# Patient Record
Sex: Female | Born: 1975
Health system: Southern US, Community
[De-identification: ages and names within clinical notes are randomized; demographics above are authoritative.]

## PROBLEM LIST (undated history)

## (undated) ENCOUNTER — Inpatient Hospital Stay (HOSPITAL_COMMUNITY): Payer: Self-pay

---

## 2000-07-20 ENCOUNTER — Encounter: Payer: Self-pay | Admitting: Internal Medicine

## 2000-07-20 ENCOUNTER — Encounter: Admission: RE | Admit: 2000-07-20 | Discharge: 2000-07-20 | Payer: Self-pay | Admitting: Internal Medicine

## 2000-07-24 ENCOUNTER — Encounter: Payer: Self-pay | Admitting: Internal Medicine

## 2000-07-24 ENCOUNTER — Encounter: Admission: RE | Admit: 2000-07-24 | Discharge: 2000-07-24 | Payer: Self-pay | Admitting: Internal Medicine

## 2001-03-03 ENCOUNTER — Other Ambulatory Visit: Admission: RE | Admit: 2001-03-03 | Discharge: 2001-03-03 | Payer: Self-pay | Admitting: Obstetrics and Gynecology

## 2001-12-02 ENCOUNTER — Emergency Department (HOSPITAL_COMMUNITY): Admission: EM | Admit: 2001-12-02 | Discharge: 2001-12-03 | Payer: Self-pay | Admitting: Emergency Medicine

## 2002-03-15 ENCOUNTER — Other Ambulatory Visit: Admission: RE | Admit: 2002-03-15 | Discharge: 2002-03-15 | Payer: Self-pay | Admitting: Obstetrics and Gynecology

## 2003-03-03 ENCOUNTER — Encounter: Payer: Self-pay | Admitting: Internal Medicine

## 2003-03-03 ENCOUNTER — Encounter: Admission: RE | Admit: 2003-03-03 | Discharge: 2003-03-03 | Payer: Self-pay | Admitting: Internal Medicine

## 2003-03-10 ENCOUNTER — Encounter: Payer: Self-pay | Admitting: Internal Medicine

## 2003-03-10 ENCOUNTER — Encounter: Admission: RE | Admit: 2003-03-10 | Discharge: 2003-03-10 | Payer: Self-pay | Admitting: Internal Medicine

## 2003-04-20 ENCOUNTER — Encounter: Payer: Self-pay | Admitting: Surgery

## 2003-04-20 ENCOUNTER — Observation Stay (HOSPITAL_COMMUNITY): Admission: RE | Admit: 2003-04-20 | Discharge: 2003-04-21 | Payer: Self-pay | Admitting: Surgery

## 2003-04-20 ENCOUNTER — Encounter (INDEPENDENT_AMBULATORY_CARE_PROVIDER_SITE_OTHER): Payer: Self-pay

## 2003-09-23 HISTORY — PX: GALLBLADDER SURGERY: SHX652

## 2004-02-22 ENCOUNTER — Other Ambulatory Visit: Admission: RE | Admit: 2004-02-22 | Discharge: 2004-02-22 | Payer: Self-pay | Admitting: Obstetrics and Gynecology

## 2004-09-11 ENCOUNTER — Emergency Department (HOSPITAL_COMMUNITY): Admission: EM | Admit: 2004-09-11 | Discharge: 2004-09-11 | Payer: Self-pay | Admitting: Emergency Medicine

## 2005-05-27 ENCOUNTER — Ambulatory Visit: Payer: Self-pay | Admitting: Endocrinology

## 2005-11-28 ENCOUNTER — Ambulatory Visit (HOSPITAL_COMMUNITY): Admission: RE | Admit: 2005-11-28 | Discharge: 2005-11-28 | Payer: Self-pay | Admitting: Internal Medicine

## 2006-05-15 ENCOUNTER — Other Ambulatory Visit: Admission: RE | Admit: 2006-05-15 | Discharge: 2006-05-15 | Payer: Self-pay | Admitting: Obstetrics and Gynecology

## 2007-08-30 ENCOUNTER — Inpatient Hospital Stay (HOSPITAL_COMMUNITY): Admission: AD | Admit: 2007-08-30 | Discharge: 2007-08-30 | Payer: Self-pay | Admitting: Obstetrics and Gynecology

## 2008-09-12 ENCOUNTER — Ambulatory Visit: Payer: Self-pay | Admitting: Internal Medicine

## 2008-10-26 ENCOUNTER — Ambulatory Visit: Payer: Self-pay | Admitting: Internal Medicine

## 2008-11-23 ENCOUNTER — Ambulatory Visit: Payer: Self-pay | Admitting: Internal Medicine

## 2009-08-31 ENCOUNTER — Ambulatory Visit: Payer: Self-pay | Admitting: Internal Medicine

## 2010-06-11 ENCOUNTER — Ambulatory Visit: Payer: Self-pay | Admitting: Internal Medicine

## 2010-09-12 ENCOUNTER — Ambulatory Visit: Payer: Self-pay | Admitting: Internal Medicine

## 2010-10-01 ENCOUNTER — Encounter: Payer: Self-pay | Admitting: Gastroenterology

## 2010-10-24 DIAGNOSIS — F411 Generalized anxiety disorder: Secondary | ICD-10-CM

## 2010-10-24 NOTE — Letter (Signed)
Summary: New Patient letter  [facility]  [address] [place], [place] [address]   Phone: [phone]  Fax: [phone]       10/01/2010 MRN: [id number]  [facility] [address] [place] [address]  Dear Ms. Rubendall,  Welcome to the Gastroenterology Division at [facility].    You are scheduled to see Dr.  Christella Hartigan on 11-06-10 at 10am on the 3rd floor at [facility], [address]  We ask that you try to arrive at our office 15 minutes prior to your appointment time to allow for check-in.  We would like you to complete the enclosed self-administered evaluation form prior to your visit and bring it with you on the day of your appointment.  We will review it with you.  Also, please bring a complete list of all your medications or, if you prefer, bring the medication bottles and we will list them.  Please bring your insurance card so that we may make a copy of it.  If your insurance requires a referral to see a specialist, please bring your referral form from your primary care physician.  Co-payments are due at the time of your visit and may be paid by cash, check or credit card.     Your office visit will consist of a consult with your physician (includes a physical exam), any laboratory testing he/she may order, scheduling of any necessary diagnostic testing (e.g. x-ray, ultrasound, CT-scan), and scheduling of a procedure (e.g. Endoscopy, Colonoscopy) if required.  Please allow enough time on your schedule to allow for any/all of these possibilities.    If you cannot keep your appointment, please call [phone] to cancel or reschedule prior to your appointment date.  This allows [place] the opportunity to schedule an appointment for another patient in need of care.  If you do not cancel or reschedule by 5 p.m. the business day prior to your appointment date, you will be charged a $50.00 late cancellation/no-show fee.    Thank you for choosing [facility] for your medical needs.  We appreciate the opportunity to care for you.  Please visit [place] at our website  to learn more about our practice.                     Sincerely,                                                             The Gastroenterology Division

## 2010-11-01 ENCOUNTER — Ambulatory Visit (INDEPENDENT_AMBULATORY_CARE_PROVIDER_SITE_OTHER): Payer: BC Managed Care – PPO | Admitting: Internal Medicine

## 2010-11-01 DIAGNOSIS — F411 Generalized anxiety disorder: Secondary | ICD-10-CM

## 2010-11-06 ENCOUNTER — Encounter (INDEPENDENT_AMBULATORY_CARE_PROVIDER_SITE_OTHER): Payer: Self-pay | Admitting: *Deleted

## 2010-11-06 ENCOUNTER — Encounter: Payer: Self-pay | Admitting: Gastroenterology

## 2010-11-06 ENCOUNTER — Other Ambulatory Visit: Payer: BC Managed Care – PPO

## 2010-11-06 ENCOUNTER — Other Ambulatory Visit: Payer: Self-pay | Admitting: Gastroenterology

## 2010-11-06 ENCOUNTER — Ambulatory Visit (INDEPENDENT_AMBULATORY_CARE_PROVIDER_SITE_OTHER): Payer: BC Managed Care – PPO | Admitting: Gastroenterology

## 2010-11-06 DIAGNOSIS — R1013 Epigastric pain: Secondary | ICD-10-CM

## 2010-11-06 DIAGNOSIS — K3189 Other diseases of stomach and duodenum: Secondary | ICD-10-CM

## 2010-11-06 LAB — COMPREHENSIVE METABOLIC PANEL
ALT: 28 U/L (ref 0–35)
AST: 22 U/L (ref 0–37)
Albumin: 4.2 g/dL (ref 3.5–5.2)
Alkaline Phosphatase: 59 U/L (ref 39–117)
BUN: 9 mg/dL (ref 6–23)
CO2: 28 mEq/L (ref 19–32)
Calcium: 9.2 mg/dL (ref 8.4–10.5)
Chloride: 106 mEq/L (ref 96–112)
Creatinine, Ser: 0.6 mg/dL (ref 0.4–1.2)
GFR: 113.06 mL/min (ref >60.00)
Glucose, Bld: 80 mg/dL (ref 70–99)
Potassium: 4.1 mEq/L (ref 3.5–5.1)
Sodium: 140 mEq/L (ref 135–145)
Total Bilirubin: 0.4 mg/dL (ref 0.3–1.2)
Total Protein: 7 g/dL (ref 6.0–8.3)

## 2010-11-06 LAB — IGA: IgA: 198 mg/dL (ref 68–378)

## 2010-11-07 LAB — CBC WITH DIFFERENTIAL/PLATELET
Basophils Absolute: 0 10*3/uL (ref 0.0–0.1)
Basophils Relative: 0.4 % (ref 0.0–3.0)
Eosinophils Absolute: 1.2 10*3/uL — ABNORMAL HIGH (ref 0.0–0.7)
Eosinophils Relative: 16.5 % — ABNORMAL HIGH (ref 0.0–5.0)
HCT: 39.8 % (ref 36.0–46.0)
Hemoglobin: 13.7 g/dL (ref 12.0–15.0)
Lymphocytes Relative: 31.5 % (ref 12.0–46.0)
Lymphs Abs: 2.3 10*3/uL (ref 0.7–4.0)
MCHC: 34.5 g/dL (ref 30.0–36.0)
MCV: 89.5 fl (ref 78.0–100.0)
Monocytes Absolute: 0.3 10*3/uL (ref 0.1–1.0)
Monocytes Relative: 4.5 % (ref 3.0–12.0)
Neutro Abs: 3.5 10*3/uL (ref 1.4–7.7)
Neutrophils Relative %: 47.1 % (ref 43.0–77.0)
Platelets: 234 10*3/uL (ref 150.0–400.0)
RBC: 4.44 Mil/uL (ref 3.87–5.11)
RDW: 12.7 % (ref 11.5–14.6)
WBC: 7.4 10*3/uL (ref 4.5–10.5)

## 2010-11-08 DIAGNOSIS — E039 Hypothyroidism, unspecified: Secondary | ICD-10-CM | POA: Insufficient documentation

## 2010-11-08 LAB — CONVERTED CEMR LAB: Tissue Transglutaminase Ab, IgA: 4.5 units (ref <20)

## 2010-11-13 NOTE — Letter (Signed)
Summary: EGD Instructions  [facility]  [address] [place], [place] [address]   Phone: [phone]  Fax: [phone]       Danielle Doyle    1976/08/25    MRN: [id number]       Procedure Day /Date:12/18/10 WED     Arrival Time: 1230 pm     Procedure Time:130 pm     Location of Procedure:                    [facility] (4th Floor)    PREPARATION FOR ENDOSCOPY   On 12/18/10  THE DAY OF THE PROCEDURE:  1.   No solid foods, milk or milk products are allowed after midnight the night before your procedure.  2.   Do not drink anything colored red or purple.  Avoid juices with pulp.  No orange juice.  3.  You may drink clear liquids until1130 am  which is 2 hours before your procedure.                                                                                                CLEAR LIQUIDS INCLUDE: Water Jello Ice Popsicles Tea (sugar ok, no milk/cream) Powdered fruit flavored drinks Coffee (sugar ok, no milk/cream) Gatorade Juice: apple, white grape, white cranberry  Lemonade Clear bullion, consomm, broth Carbonated beverages (any kind) Strained chicken noodle soup Hard Candy   MEDICATION INSTRUCTIONS  Unless otherwise instructed, you should take regular prescription medications with a small sip of water as early as possible the morning of your procedure.               OTHER INSTRUCTIONS  You will need a responsible adult at least 37 years of age to accompany you and drive you home.   This person must remain in the waiting room during your procedure.  Wear loose fitting clothing that is easily removed.  Leave jewelry and other valuables at home.  However, you may wish to bring a book to read or an iPod/MP3 player to listen to music as you wait for your procedure to start.  Remove all body piercing jewelry and leave at home.  Total time from sign-in until discharge is approximately 2-3 hours.  You should go home directly  after your procedure and rest.  You can resume normal activities the day after your procedure.  The day of your procedure you should not:   Drive   Make legal decisions   Operate machinery   Drink alcohol   Return to work  You will receive specific instructions about eating, activities and medications before you leave.    The above instructions have been reviewed and explained to me by   _______________________    I fully understand and can verbalize these instructions _____________________________ Date _________

## 2010-11-13 NOTE — Assessment & Plan Note (Signed)
History of Present Illness Visit Type: Initial Consult Primary GI MD: Rob Bunting MD Primary Provider: Marlan Palau, MD Requesting Provider: Marlan Palau, MD Chief Complaint: Starting in November pt noticed after eating certain foods pt will have upper abd pain, nausea and bloating.  History of Present Illness:      very pleasant 35 year old woman who has noticed certain foods or overeating or stressful situations can cause epigastric discomfort ("very similar to GB pain").  Pain and burning, bloating, mild nausea.  Will last 2-4 hours, then it will ease off.  Drinking a carbonated beverage will often help it quickly.    Yogurt can trigger it.  She drinks soy milk usually.  She had h. pylori years ago, treated with antibiotics.  started fluoxetine  started dexilant recently, not really taking it reliably due to side effects (palpatations, insomnia).  DId help with her abd discomforts.  No nsaids.  Overall wieght up a bit.  husband had major brain surgery for accusic neuroma at [facility].           Current Medications (verified): 1)  Metformin Hcl 500 Mg Tabs (Metformin Hcl) .... One Tablet By Mouth Once Daily 2)  Fluoxetine Hcl 20 Mg Caps (Fluoxetine Hcl) .... One Tablet By Mouth Once Daily 3)  Dexilant 60 Mg Cpdr (Dexlansoprazole) .... One Tablet By Mouth Twice A Week  Allergies (verified): 1)  ! Doxycycline  Past History:  Past Medical History: endometriosis, anemia, anxiety, UTI  Past Surgical History: lap chole 2004 for dysfucntional GB endometrial polyps removed 2003, 2004 Uterine polyps removed 2009 and 2010  Family History: breast cancer Crohn's disease Esophagus cancer  Social History: she is married, she has no children, she is an Production designer, theatre/television/film for higher education, she does not smokees or drink alcohol. Her husband recently underwent brain surgery at [facility], he is recovering well  Review of Systems       Pertinent positive and negative review of  systems were noted in the above HPI and GI specific review of systems.  All other review of systems was otherwise negative.   Vital Signs:  Patient profile:   35 year old female Height:      65 inches Weight:      190 pounds BMI:     31.73 Pulse rate:   88 / minute Pulse rhythm:   regular BP sitting:   118 / 68  (left arm) Cuff size:   regular  Vitals Entered By: Christie Nottingham CMA Duncan Dull) (November 06, 2010 10:15 AM)  Physical Exam  Additional Exam:  Constitutional: generally well appearing Psychiatric: alert and oriented times 3 Eyes: extraocular movements intact Mouth: oropharynx moist, no lesions Neck: supple, no lymphadenopathy Cardiovascular: heart regular rate and rythm Lungs: CTA bilaterally Abdomen: soft, non-tender, non-distended, no obvious ascites, no peritoneal signs, normal bowel sounds Extremities: no lower extremity edema bilaterally Skin: no lesions on visible extremities    Impression & Recommendations:  Problem # 1:  Dyspepsia lactose intolerance may be playing a role. He gastritis, peptic ulcer disease is also possible. We will proceed with EGD at her soonest convenience. She has had her gallbladder removed. She will get basic set of labs including CBC, complete metabolic profile, celiac sprue testing. She is very clear that dexilant has definitely helped her symptoms however she did not want to take it for possible side effects that is causing. None of those side effects are listed as usual problems with the medicine however think it is best to just try a  different proton pump inhibitor.  Other Orders: TLB-CBC Platelet - w/Differential (85025-CBCD) TLB-CMP (Comprehensive Metabolic Pnl) (80053-COMP) TLB-IgA (Immunoglobulin A) (82784-IGA) T-Sprue Panel (Celiac Disease Aby Eval) (83516x3/[address])  Patient Instructions: 1)  Try avoiding dairy for one week.  If you feel better then you need to do a "dairy" challenge to see if you are lactose  intolerant. 2)  Stop the dexilant. 3)  Trial of nexium, once daily, 20-30 min before breakfast. 4)  You will be scheduled to have an upper endoscopy. 5)  You will get lab test(s) done today (cbc, cmet, total IgA level, celiac sprue). 6)  A copy of this information will be sent to Dr. Lenord Fellers. 7)  The medication list was reviewed and reconciled.  All changed / newly prescribed medications were explained.  A complete medication list was provided to the patient / caregiver.  Appended Document: Orders Update/EGD    Clinical Lists Changes  Orders: Added new Test order of EGD (EGD) - Signed      Appended Document:  Samples Given # 5  -Lot Number:  [id number]

## 2010-12-18 ENCOUNTER — Other Ambulatory Visit: Payer: BC Managed Care – PPO | Admitting: Gastroenterology

## 2011-01-17 ENCOUNTER — Ambulatory Visit (INDEPENDENT_AMBULATORY_CARE_PROVIDER_SITE_OTHER): Payer: BC Managed Care – PPO | Admitting: Internal Medicine

## 2011-01-17 DIAGNOSIS — E039 Hypothyroidism, unspecified: Secondary | ICD-10-CM

## 2011-02-07 NOTE — Op Note (Signed)
Danielle Doyle, Danielle Doyle                           ACCOUNT NO.:  [id number]   MEDICAL RECORD NO.:  [id number]                   PATIENT TYPE:  AMB   LOCATION:  DAY                                  FACILITY:  [facility]   PHYSICIAN:  Abigail Miyamoto, M.D.              DATE OF BIRTH:  1976/03/12   DATE OF PROCEDURE:  04/20/2003  DATE OF DISCHARGE:                                 OPERATIVE REPORT   PREOPERATIVE DIAGNOSIS:  Biliary dyskinesia.   POSTOPERATIVE DIAGNOSIS:  Biliary dyskinesia.   PROCEDURE:  Laparoscopic cholecystectomy with intraoperative cholangiogram.   SURGEON:  Abigail Miyamoto, M.D.   ANESTHESIA:  General endotracheal anesthesia.   ESTIMATED BLOOD LOSS:  Minimal.   INDICATIONS FOR PROCEDURE:  Danielle Doyle is a 35 year old female whose been  experiencing right upper quadrant epigastric abdominal pain, nausea and  vomiting which is related to fatty meals. She has had a thorough workup  including a normal ultrasound and a HIDA scan showing a 30% ejection  fraction. Because of  the findings of biliary dyskinesia and probable  chronic cholecystitis, a decision was made to proceed to the operating room  for laparoscopic cholecystectomy.   FINDINGS:  The patient was found to have a chronically scarred appearing  gallbladder. Cholangiogram was normal.   DESCRIPTION OF PROCEDURE:  The patient was brought to the operating room,  identified as [employer] She was placed supine on the operating room table  and general anesthesia was induced. Her abdomen was then prepped and draped  in the usual sterile fashion. Using a #15 blade, a small transverse incision  was made below the umbilicus. The incision carried down through the fascia  which was then opened with a scalpel. A hemostat was then used to pass  through the peritoneal cavity. Next, a #0 Vicryl pursestring suture was  placed around the fascial opening. The Hasson port was placed through the  opening and insufflation of  the abdomen was begun. Next, a 12 mm port was  placed in the patient's mid epigastrium and two 5 mm ports were placed in  the patient's right flank under direct vision. The gallbladder was then  grasped and retracted above the liver bed. Dissection was then carried out  at the base of the gallbladder. The cystic duct was then easily dissected  out and clipped once distally. This was then partly transected with the  laparoscopic scissors. An angiocatheter was then inserted under direct  vision into the right upper quadrant. The cholangiocatheter was placed  through the angiocatheter and placed into the opening of the cystic duct. A  cholangiogram with contrast was then performed under direct fluoroscopy.  Good flow of contrast was seen into the entire biliary system and duodenum  without evidence of abnormality. Once the cholangiogram was performed, the  catheter was removed and then the cystic duct was clipped three times  proximally and completely transected. The cystic  artery in the posterior  branch were identified and clipped twice proximally, once distally and  transected as well. The gallbladder was then slowly dissected free from the  liver bed with the electrocautery. Once the gallbladder was free from the  liver bed, it was pulled out through the incision at the umbilicus. The  liver bed was again examined and hemostasis was felt to be achieved. Using  #0 Vicryl, the umbilicus was then tied in place closing the fascial defect.  The abdomen was then irrigated with normal saline. Again hemostasis appeared  to be achieved. All  ports were removed under direct vision and the abdomen  was deflated. All incisions were then anesthetized with 0.25% Marcaine and  then closed with 4-0 Monocryl subcuticular sutures. Steri-Strips, gauze and  tape were then applied. The patient tolerated the procedure well. All  sponge, needle and instrument counts were correct at the end of the  procedure. The  patient was then extubated in the operating room and taken in  stable condition to the recovery room.                                               Abigail Miyamoto, M.D.    DB/MEDQ  D:  04/20/2003  T:  04/20/2003  Job:  [id number]   cc:   Luanna Cole. Lenord Fellers, M.D.  [address], [place]  [place]  [place] [address]  Fax: [phone]

## 2011-03-20 ENCOUNTER — Telehealth: Payer: Self-pay | Admitting: Internal Medicine

## 2011-03-20 NOTE — Telephone Encounter (Signed)
I have refilled Valtrex 500mg  (310) one bid for 5 days with prn one year refill to [facility] [address] [phone]

## 2011-06-30 LAB — CBC
MCHC: 35.2
MCV: 88.7
RBC: 4.5

## 2011-08-12 ENCOUNTER — Other Ambulatory Visit: Payer: BC Managed Care – PPO | Admitting: Internal Medicine

## 2011-08-12 DIAGNOSIS — E039 Hypothyroidism, unspecified: Secondary | ICD-10-CM

## 2011-08-12 LAB — TSH: TSH: 1.621 u[IU]/mL (ref 0.350–4.500)

## 2011-08-13 ENCOUNTER — Other Ambulatory Visit: Payer: Self-pay

## 2011-08-13 MED ORDER — SYNTHROID 50 MCG PO TABS: 50.0000 ug | ORAL_TABLET | Freq: Every day | ORAL | Status: DC

## 2011-09-11 ENCOUNTER — Telehealth: Payer: Self-pay | Admitting: Internal Medicine

## 2011-09-11 MED ORDER — SYNTHROID 50 MCG PO TABS: 50.0000 ug | ORAL_TABLET | Freq: Every day | ORAL | Status: DC

## 2011-09-11 NOTE — Telephone Encounter (Signed)
Refill for 30 days. In the fututre, she should have pharmacy contact us by e-mail.

## 2011-09-25 ENCOUNTER — Ambulatory Visit (INDEPENDENT_AMBULATORY_CARE_PROVIDER_SITE_OTHER): Payer: BC Managed Care – PPO | Admitting: Internal Medicine

## 2011-09-25 ENCOUNTER — Encounter: Payer: Self-pay | Admitting: Internal Medicine

## 2011-09-25 DIAGNOSIS — J45990 Exercise induced bronchospasm: Secondary | ICD-10-CM

## 2011-09-25 DIAGNOSIS — E039 Hypothyroidism, unspecified: Secondary | ICD-10-CM

## 2011-09-25 NOTE — Patient Instructions (Signed)
Use inhaler 15 minutes before exercise. Continue Thyroid replacement med. See you in 6 months. Keep GYn appt next week.

## 2011-11-24 NOTE — Progress Notes (Signed)
  Subjective:    Patient ID: Danielle Doyle, female    DOB: [birth year], 36 y.o.   MRN: [id number]  HPI 36 year old white female in today for followup of hypothyroidism. Is also had recent URI symptoms with wheezing. History of asthma. No fever or shaking chills. Cough is productive. Some shortness of breath.     Review of Systems     Objective:   Physical Exam HEENT exam: Slightly injected pharynx. Neck is supple without adenopathy. Scattered inspiratory wheezing        Assessment & Plan:  Asthmatic bronchitis.  Hypothyroidism  Plan: Zithromax Z-Pak take 2 tablets by mouth day one followed by 1 tablet by mouth days 2 through 5. Sterapred DS 10 mg 6 day dosepak. Ventolin inhaler 2 sprays by mouth 4 times a day when necessary wheezing. Continue same dose of Synthroid. TSH checked today. Return in 6 months for followup on hypothyroidism.

## 2012-03-23 ENCOUNTER — Encounter: Payer: Self-pay | Admitting: Internal Medicine

## 2012-03-23 ENCOUNTER — Ambulatory Visit (INDEPENDENT_AMBULATORY_CARE_PROVIDER_SITE_OTHER): Payer: BC Managed Care – PPO | Admitting: Internal Medicine

## 2012-03-23 VITALS — BP 122/74 | HR 80 | Temp 98.1°F | Ht 65.0 in | Wt 199.0 lb

## 2012-03-23 DIAGNOSIS — E039 Hypothyroidism, unspecified: Secondary | ICD-10-CM

## 2012-03-23 DIAGNOSIS — Z23 Encounter for immunization: Secondary | ICD-10-CM

## 2012-03-23 LAB — TSH: TSH: 2.216 u[IU]/mL (ref 0.350–4.500)

## 2012-03-23 NOTE — Patient Instructions (Addendum)
Continue same dose of Synthroid pending TSH results. You have been given tetanus immunization update today which is good for 10 years.

## 2012-03-23 NOTE — Addendum Note (Signed)
Addended by: Judy Pimple on: 03/23/2012 11:24 AM   Modules accepted: Orders

## 2012-03-23 NOTE — Progress Notes (Signed)
  Subjective:    Patient ID: Danielle Doyle, female    DOB: 01-Jun-1976, 36 y.o.   MRN: [id number]  HPI 36 year old white female with history of hypothyroidism in today for followup. TSH drawn today. Patient has noticed some issues with her weight over the past 6 months. She sprained her ankle has taken her some time to get her exercise pattern back. Her husband has recently bought her an exercise bike. She and her husband are going to [place] in the next couple of weeks. She has a history of dyspepsia. This is stable. Tetanus immunization update given today.    Review of Systems     Objective:   Physical Exam neck is supple without thyromegaly; chest clear to auscultation; cardiac exam regular rate and rhythm extremities without edema        Assessment & Plan:  Hypothyroidism  Weight gain secondary to inactivity  History of dyspepsia  [place] travel-Tdap given today. Prescription for Cipro 500 mg twice daily for 7 days with no refill. Phenergan 25 mg tablets #30 one by mouth every 4 hours when necessary nausea if needed on trip. Return in 6 months.

## 2012-05-27 ENCOUNTER — Other Ambulatory Visit: Payer: Self-pay | Admitting: Internal Medicine

## 2012-07-13 ENCOUNTER — Other Ambulatory Visit: Payer: Self-pay | Admitting: Internal Medicine

## 2012-09-03 ENCOUNTER — Other Ambulatory Visit: Payer: Self-pay | Admitting: Internal Medicine

## 2012-09-03 MED ORDER — LEVOTHYROXINE SODIUM 50 MCG PO TABS: 50.0000 ug | ORAL_TABLET | Freq: Every day | ORAL | Status: DC

## 2012-09-03 NOTE — Telephone Encounter (Signed)
See if Needs TSH and OV please before refilling

## 2012-10-07 ENCOUNTER — Ambulatory Visit: Payer: BC Managed Care – PPO | Admitting: Internal Medicine

## 2012-11-29 ENCOUNTER — Ambulatory Visit (INDEPENDENT_AMBULATORY_CARE_PROVIDER_SITE_OTHER): Payer: BC Managed Care – PPO | Admitting: Internal Medicine

## 2012-11-29 ENCOUNTER — Encounter: Payer: Self-pay | Admitting: Internal Medicine

## 2012-11-29 VITALS — BP 110/72 | Temp 98.0°F | Wt 202.0 lb

## 2012-11-29 DIAGNOSIS — E039 Hypothyroidism, unspecified: Secondary | ICD-10-CM

## 2012-11-29 DIAGNOSIS — H6693 Otitis media, unspecified, bilateral: Secondary | ICD-10-CM

## 2012-11-29 DIAGNOSIS — H6690 Otitis media, unspecified, unspecified ear: Secondary | ICD-10-CM

## 2012-11-29 DIAGNOSIS — Z8709 Personal history of other diseases of the respiratory system: Secondary | ICD-10-CM

## 2012-11-29 DIAGNOSIS — J069 Acute upper respiratory infection, unspecified: Secondary | ICD-10-CM

## 2012-11-29 NOTE — Patient Instructions (Addendum)
Take Levaquin 500 milligrams daily with a meal for 7 days. Use inhaler if needed. Take over-the-counter decongestant starting today for ear infection with anticipated airplane travel. Use Diflucan 150 mg tablet by mouth if needed for candida vaginitis. Thyroid functions have been checked a day

## 2012-11-29 NOTE — Progress Notes (Signed)
  Subjective:    Patient ID: Danielle Doyle, female    DOB: [birth year], 37 y.o.   MRN: [id number]  HPI 37 year old white female with history of hypothyroidism in today with acute URI symptoms onset March 7. Has had cough with some discolored sputum. No frank wheezing. Has history of asthma usually exercise induced. Has inhaler on hand. Pulse oximetry is 97% on room air. Has bilateral ear pain. Has to fly at the end of the week and is concerned. Sounds nasally congested. No fever or shaking chills.  Also concerned about her thyroid condition. Says hairdresser is noted changes in her hair texture. Says it's more dry. Patient says her skin is dry. Wants to have thyroid check. Last checked July 2013 and was within normal limits.    Review of Systems     Objective:   Physical Exam  HENT:  Head: Normocephalic and atraumatic.  Mouth/Throat: Oropharynx is clear and moist. No oropharyngeal exudate.  Both TMs are full and dull bilaterally  Eyes: Right eye exhibits discharge. Left eye exhibits no discharge.  Neck: No thyromegaly present.  Pulmonary/Chest: Effort normal and breath sounds normal. She has no wheezes. She has no rales.          Assessment & Plan:  Bilateral otitis media  History of asthma  Acute upper respiratory infection  Hypothyroidism  Plan: Levaquin 500 milligrams daily for 7 days. Diflucan 150 mg tablet with one refill to take if she develops Candida vaginitis while on antibiotics. Free T4 and TSH checked today. Says she has refill on Ventolin inhaler.  Plan: Time spent with patient 25 minutes

## 2012-11-30 LAB — T4, FREE: Free T4: 1.29 ng/dL (ref 0.80–1.80)

## 2012-12-08 ENCOUNTER — Other Ambulatory Visit: Payer: Self-pay

## 2012-12-08 ENCOUNTER — Other Ambulatory Visit: Payer: Self-pay | Admitting: Internal Medicine

## 2012-12-08 MED ORDER — LEVOTHYROXINE SODIUM 50 MCG PO TABS: 50.0000 ug | ORAL_TABLET | Freq: Every day | ORAL | Status: DC

## 2013-06-13 ENCOUNTER — Other Ambulatory Visit: Payer: Self-pay

## 2013-06-13 MED ORDER — LEVOTHYROXINE SODIUM 50 MCG PO TABS: 50.0000 ug | ORAL_TABLET | Freq: Every day | ORAL | Status: DC

## 2013-06-14 ENCOUNTER — Other Ambulatory Visit: Payer: Self-pay

## 2013-06-14 DIAGNOSIS — Z1231 Encounter for screening mammogram for malignant neoplasm of breast: Secondary | ICD-10-CM

## 2013-06-17 ENCOUNTER — Ambulatory Visit
Admission: RE | Admit: 2013-06-17 | Discharge: 2013-06-17 | Disposition: A | Discharge status: Home / Self Care | Payer: BC Managed Care – PPO | Source: Ambulatory Visit

## 2013-06-17 DIAGNOSIS — Z1231 Encounter for screening mammogram for malignant neoplasm of breast: Secondary | ICD-10-CM

## 2013-06-20 ENCOUNTER — Other Ambulatory Visit: Payer: Self-pay | Admitting: Obstetrics and Gynecology

## 2013-06-20 DIAGNOSIS — R928 Other abnormal and inconclusive findings on diagnostic imaging of breast: Secondary | ICD-10-CM

## 2013-06-30 ENCOUNTER — Ambulatory Visit
Admission: RE | Admit: 2013-06-30 | Discharge: 2013-06-30 | Disposition: A | Discharge status: Home / Self Care | Payer: BC Managed Care – PPO | Source: Ambulatory Visit | Attending: Obstetrics and Gynecology | Admitting: Obstetrics and Gynecology

## 2013-06-30 DIAGNOSIS — R928 Other abnormal and inconclusive findings on diagnostic imaging of breast: Secondary | ICD-10-CM

## 2013-09-29 ENCOUNTER — Ambulatory Visit (INDEPENDENT_AMBULATORY_CARE_PROVIDER_SITE_OTHER): Payer: BC Managed Care – PPO | Admitting: Internal Medicine

## 2013-09-29 ENCOUNTER — Encounter: Payer: Self-pay | Admitting: Internal Medicine

## 2013-09-29 VITALS — BP 116/74 | HR 72 | Temp 98.6°F | Wt 203.5 lb

## 2013-09-29 DIAGNOSIS — J019 Acute sinusitis, unspecified: Secondary | ICD-10-CM

## 2013-09-29 DIAGNOSIS — E039 Hypothyroidism, unspecified: Secondary | ICD-10-CM

## 2013-09-29 DIAGNOSIS — R609 Edema, unspecified: Secondary | ICD-10-CM

## 2013-09-29 DIAGNOSIS — H6693 Otitis media, unspecified, bilateral: Secondary | ICD-10-CM

## 2013-09-29 DIAGNOSIS — H6690 Otitis media, unspecified, unspecified ear: Secondary | ICD-10-CM

## 2013-09-29 MED ORDER — FUROSEMIDE 40 MG PO TABS: 40.0000 mg | ORAL_TABLET | Freq: Every day | ORAL | Status: DC

## 2013-09-29 MED ORDER — AZITHROMYCIN 250 MG PO TABS: ORAL_TABLET | ORAL | Status: DC

## 2013-09-29 MED ORDER — ALPRAZOLAM 0.5 MG PO TABS: 0.5000 mg | ORAL_TABLET | Freq: Two times a day (BID) | ORAL | Status: DC | PRN

## 2013-09-29 NOTE — Patient Instructions (Signed)
Take Zithromax Z-PAK as directed. If not better in one week have prescription refill. TSH drawn. Take Lasix sparingly for dependent edema. Xanax refilled for anxiety

## 2013-09-29 NOTE — Progress Notes (Signed)
   Subjective:    Patient ID: Danielle Doyle, female    DOB: [birth year], 38 y.o.   MRN: [id number]  HPI 38 year old Female in today with sinusitis symptoms. Has had maxillary sinus pressure and postnasal drip. Also wants refill on Lasix for dependent edema which she seldom takes. Also needs refill of Xanax which she takes for flying and anxiety. Needs followup on hypothyroidism. TSH drawn today. Is on Synthroid.    Review of Systems     Objective:   Physical Exam HEENT exam: TMs are full but not red and pharynx is slightly injected but not red or with exudate  Neck is supple. Chest clear. No thyromegaly.       Assessment & Plan:  Sinusitis-see below  Hypothyroidism-currently on Synthroid 0.05 mg daily  History of dependent edema treated with Lasix  Plan: Zithromax Z-Pak take as directed with one refill. Not better in one week have prescription refilled. Lasix 40 mg #30 one by mouth daily when necessary dependent edema. Xanax 0.5 mg #60 with no refill to take twice a day when necessary. TSH is drawn and is pending.  Most of her care is done through GYN physician. She is on metformin per GYN.  25 minutes spent with patient

## 2013-09-30 LAB — TSH: TSH: 2.006 u[IU]/mL (ref 0.350–4.500)

## 2013-10-05 ENCOUNTER — Other Ambulatory Visit: Payer: Self-pay | Admitting: Internal Medicine

## 2013-10-05 ENCOUNTER — Other Ambulatory Visit: Payer: Self-pay

## 2013-10-05 MED ORDER — LEVOTHYROXINE SODIUM 50 MCG PO TABS: 50.0000 ug | ORAL_TABLET | Freq: Every day | ORAL | Status: DC

## 2013-12-27 ENCOUNTER — Other Ambulatory Visit: Payer: BC Managed Care – PPO | Admitting: Internal Medicine

## 2013-12-30 ENCOUNTER — Encounter: Payer: BC Managed Care – PPO | Admitting: Internal Medicine

## 2014-03-23 ENCOUNTER — Ambulatory Visit (INDEPENDENT_AMBULATORY_CARE_PROVIDER_SITE_OTHER): Payer: BC Managed Care – PPO | Admitting: Internal Medicine

## 2014-03-23 ENCOUNTER — Encounter: Payer: Self-pay | Admitting: Internal Medicine

## 2014-03-23 VITALS — BP 116/72 | HR 72 | Temp 98.3°F | Wt 203.0 lb

## 2014-03-23 DIAGNOSIS — J019 Acute sinusitis, unspecified: Secondary | ICD-10-CM

## 2014-03-23 MED ORDER — AZITHROMYCIN 250 MG PO TABS: ORAL_TABLET | ORAL | Status: DC

## 2014-04-12 ENCOUNTER — Other Ambulatory Visit: Payer: Self-pay

## 2014-04-12 MED ORDER — LEVOTHYROXINE SODIUM 50 MCG PO TABS: 50.0000 ug | ORAL_TABLET | Freq: Every day | ORAL | Status: DC

## 2014-04-23 NOTE — Patient Instructions (Signed)
Take Zithromax Z-PAK as corrected. Call if not better in 10 days

## 2014-04-23 NOTE — Progress Notes (Signed)
   Subjective:    Patient ID: Danielle Doyle, female    DOB: 1976/03/03, 38 y.o.   MRN: [id number]  HPI  Complaining of malaise fatigue and maxillary sinus tenderness and pressure. History of sinus infections and hypothyroidism.    Review of Systems     Objective:   Physical Exam  TMs are clear. Pharynx slightly injected. Neck is supple. Chest is clear. Has bilateral maxillary sinus tenderness      Assessment & Plan:  Acute sinusitis  Plan: Zithromax Z-Pak take 2 tablets day one followed by 1 tablet days 2 through 5. Call if not better in 10 days.

## 2014-07-07 ENCOUNTER — Other Ambulatory Visit: Payer: Self-pay

## 2014-07-07 MED ORDER — LEVOTHYROXINE SODIUM 50 MCG PO TABS: 50.0000 ug | ORAL_TABLET | Freq: Every day | ORAL | Status: DC

## 2014-08-07 ENCOUNTER — Other Ambulatory Visit: Payer: Self-pay

## 2014-08-07 DIAGNOSIS — Z1231 Encounter for screening mammogram for malignant neoplasm of breast: Secondary | ICD-10-CM

## 2014-08-09 ENCOUNTER — Encounter (INDEPENDENT_AMBULATORY_CARE_PROVIDER_SITE_OTHER): Payer: Self-pay

## 2014-08-09 ENCOUNTER — Ambulatory Visit
Admission: RE | Admit: 2014-08-09 | Discharge: 2014-08-09 | Disposition: A | Discharge status: Home / Self Care | Payer: BC Managed Care – PPO | Source: Ambulatory Visit

## 2014-08-09 DIAGNOSIS — Z1231 Encounter for screening mammogram for malignant neoplasm of breast: Secondary | ICD-10-CM

## 2014-10-25 ENCOUNTER — Other Ambulatory Visit: Payer: Self-pay | Admitting: Obstetrics and Gynecology

## 2014-10-25 DIAGNOSIS — N644 Mastodynia: Secondary | ICD-10-CM

## 2014-10-25 DIAGNOSIS — Z803 Family history of malignant neoplasm of breast: Secondary | ICD-10-CM

## 2014-11-07 ENCOUNTER — Ambulatory Visit (INDEPENDENT_AMBULATORY_CARE_PROVIDER_SITE_OTHER): Payer: BC Managed Care – PPO | Admitting: Internal Medicine

## 2014-11-07 ENCOUNTER — Encounter: Payer: Self-pay | Admitting: Internal Medicine

## 2014-11-07 VITALS — BP 112/70 | HR 83 | Temp 99.0°F | Wt 206.0 lb

## 2014-11-07 DIAGNOSIS — B009 Herpesviral infection, unspecified: Secondary | ICD-10-CM | POA: Insufficient documentation

## 2014-11-07 DIAGNOSIS — H6503 Acute serous otitis media, bilateral: Secondary | ICD-10-CM

## 2014-11-07 MED ORDER — VALACYCLOVIR HCL 500 MG PO TABS: 500.0000 mg | ORAL_TABLET | Freq: Two times a day (BID) | ORAL | Status: DC

## 2014-11-07 MED ORDER — AZITHROMYCIN 250 MG PO TABS: ORAL_TABLET | ORAL | Status: DC

## 2014-11-07 NOTE — Patient Instructions (Addendum)
Take Valtrex as directed for fever blister. Take Zithromax Z-PAK as prescribed.

## 2014-11-07 NOTE — Progress Notes (Signed)
   Subjective:    Patient ID: Danielle Doyle, female    DOB: 04/20/1976, 39 y.o.   MRN: [id number]  HPI 39 year old Female with some postnasal drip, slight sore throat and some right neck pain. No fever or shaking chills. Had cold sore on her nose so she thought she was coming down with something. Some nasal congestion which is slight not discolored.    Review of Systems     Objective:   Physical Exam  There is no adenopathy in her neck. Pharynx is not injected. Both TMs are dull bilaterally but not red. Neck is supple without significant tenderness. Chest clear to auscultation.      Assessment & Plan:  Bilateral otitis media  Herpes simplex type I  Plan: She usually does well with a Z-Pak. Prescribed Zithromax 2 tablets by mouth day one followed by 1 by mouth days 2 through 5. Refill Valtrex for herpes simplex type I.

## 2014-12-18 ENCOUNTER — Telehealth: Payer: Self-pay | Admitting: *Deleted

## 2014-12-18 NOTE — Telephone Encounter (Signed)
Called patient regarding refill request for Synthroid patient last TSH here was 09/2013 . Patient states she did not request a refill she is going through fertility treatments and they are regulating her synthroid at this time she is currently taking 79mcg.  She will schedule an appt with [place] when she needs [place] to regulate it.

## 2015-05-07 ENCOUNTER — Ambulatory Visit (INDEPENDENT_AMBULATORY_CARE_PROVIDER_SITE_OTHER): Payer: BC Managed Care – PPO | Admitting: Internal Medicine

## 2015-05-07 ENCOUNTER — Encounter: Payer: Self-pay | Admitting: Internal Medicine

## 2015-05-07 VITALS — BP 126/84 | HR 80 | Temp 98.0°F | Wt 201.0 lb

## 2015-05-07 DIAGNOSIS — J039 Acute tonsillitis, unspecified: Secondary | ICD-10-CM | POA: Diagnosis not present

## 2015-05-07 DIAGNOSIS — H6693 Otitis media, unspecified, bilateral: Secondary | ICD-10-CM

## 2015-05-07 MED ORDER — AZITHROMYCIN 250 MG PO TABS: ORAL_TABLET | ORAL | Status: DC

## 2015-05-07 NOTE — Patient Instructions (Signed)
Take Zithromax Z-PAK as directed. Call if not better in 7-10 days or sooner if worse

## 2015-05-07 NOTE — Progress Notes (Signed)
   Subjective:    Patient ID: Danielle Doyle, female    DOB: 05/18/1976, 39 y.o.   MRN: [id number]  HPI 8 day history of sore throat. She went to [facility] this morning and was told that she had a tonsillar stone. Sore throat started on right side of throat. [facility]. No fever or shaking chills. Complaining of bilateral ear pain. Was not given anabiotic's. Was told it was probably viral.    Review of Systems     Objective:   Physical Exam Small white pustule right tonsil. Pharynx is red as are tonsils bilaterally. Neck is supple without adenopathy. TMs are dull thick but not red bilaterally. Chest is clear to auscultation without rales or wheezing       Assessment & Plan:  Tonsillitis bilateral otitis media  Plan: Zithromax Z-Pak take 2 tablets by mouth day one followed by 1 tablet by mouth days 2 through 5.

## 2015-09-03 ENCOUNTER — Other Ambulatory Visit: Payer: BC Managed Care – PPO | Admitting: Internal Medicine

## 2015-09-07 ENCOUNTER — Encounter: Payer: BC Managed Care – PPO | Admitting: Internal Medicine

## 2015-09-24 ENCOUNTER — Inpatient Hospital Stay (HOSPITAL_COMMUNITY): Payer: BC Managed Care – PPO

## 2015-09-24 ENCOUNTER — Inpatient Hospital Stay (HOSPITAL_COMMUNITY)
Admission: AD | Admit: 2015-09-24 | Discharge: 2015-09-24 | Disposition: A | Discharge status: Home / Self Care | Payer: BC Managed Care – PPO | Source: Ambulatory Visit | Attending: Obstetrics and Gynecology | Admitting: Obstetrics and Gynecology

## 2015-09-24 ENCOUNTER — Encounter (HOSPITAL_COMMUNITY): Payer: Self-pay | Admitting: *Deleted

## 2015-09-24 DIAGNOSIS — Z881 Allergy status to other antibiotic agents status: Secondary | ICD-10-CM | POA: Insufficient documentation

## 2015-09-24 DIAGNOSIS — Z91013 Allergy to seafood: Secondary | ICD-10-CM | POA: Insufficient documentation

## 2015-09-24 DIAGNOSIS — R109 Unspecified abdominal pain: Secondary | ICD-10-CM | POA: Insufficient documentation

## 2015-09-24 DIAGNOSIS — Z7984 Long term (current) use of oral hypoglycemic drugs: Secondary | ICD-10-CM | POA: Diagnosis not present

## 2015-09-24 DIAGNOSIS — D259 Leiomyoma of uterus, unspecified: Secondary | ICD-10-CM | POA: Diagnosis not present

## 2015-09-24 DIAGNOSIS — O4691 Antepartum hemorrhage, unspecified, first trimester: Secondary | ICD-10-CM | POA: Diagnosis not present

## 2015-09-24 DIAGNOSIS — O26899 Other specified pregnancy related conditions, unspecified trimester: Secondary | ICD-10-CM

## 2015-09-24 DIAGNOSIS — O3680X0 Pregnancy with inconclusive fetal viability, not applicable or unspecified: Secondary | ICD-10-CM | POA: Diagnosis not present

## 2015-09-24 DIAGNOSIS — O209 Hemorrhage in early pregnancy, unspecified: Secondary | ICD-10-CM

## 2015-09-24 DIAGNOSIS — Z3A01 Less than 8 weeks gestation of pregnancy: Secondary | ICD-10-CM | POA: Insufficient documentation

## 2015-09-24 DIAGNOSIS — O4690 Antepartum hemorrhage, unspecified, unspecified trimester: Secondary | ICD-10-CM | POA: Diagnosis present

## 2015-09-24 LAB — URINE MICROSCOPIC-ADD ON
Bacteria, UA: NONE SEEN
SQUAMOUS EPITHELIAL / LPF: NONE SEEN
WBC UA: NONE SEEN WBC/hpf (ref 0–5)

## 2015-09-24 LAB — CBC
HCT: 39.4 % (ref 36.0–46.0)
Hemoglobin: 13.4 g/dL (ref 12.0–15.0)
MCH: 30.5 pg (ref 26.0–34.0)
MCHC: 34 g/dL (ref 30.0–36.0)
MCV: 89.5 fL (ref 78.0–100.0)
PLATELETS: 260 10*3/uL (ref 150–400)
RBC: 4.4 MIL/uL (ref 3.87–5.11)
RDW: 12.6 % (ref 11.5–15.5)
WBC: 13.9 10*3/uL — AB (ref 4.0–10.5)

## 2015-09-24 LAB — URINALYSIS, ROUTINE W REFLEX MICROSCOPIC
BILIRUBIN URINE: NEGATIVE
GLUCOSE, UA: NEGATIVE mg/dL
Ketones, ur: 15 mg/dL — AB
Nitrite: POSITIVE — AB
PROTEIN: 100 mg/dL — AB
Specific Gravity, Urine: 1.025 (ref 1.005–1.030)
pH: 5 (ref 5.0–8.0)

## 2015-09-24 LAB — POCT PREGNANCY, URINE: PREG TEST UR: POSITIVE — AB

## 2015-09-24 LAB — ABO/RH: ABO/RH(D): O POS

## 2015-09-24 LAB — HCG, QUANTITATIVE, PREGNANCY: hCG, Beta Chain, Quant, S: 4642 m[IU]/mL — ABNORMAL HIGH (ref <5)

## 2015-09-24 NOTE — Discharge Instructions (Signed)

## 2015-09-24 NOTE — MAU Note (Signed)
Pt doing IVF through [facility] in [place], following [facility]s.  On Friday [facility]CG was over 2,000.  Started cramping today & bleeding heavily, passing clots.

## 2015-09-24 NOTE — MAU Provider Note (Signed)
History     CSN: [id number]  Arrival date and time: 09/24/15 1617   First Provider Initiated Contact with Patient 09/24/15 1804      Chief Complaint  Patient presents with  . Abdominal Pain  . Vaginal Bleeding   HPI   Ms.Danielle Doyle is a 40 y.o. female G2P0010 at [redacted]w[redacted]d presenting to MAU with vaginal bleeding. She had IVF on December 14 with Dr. Maurine Cane in [place].   She has had two quants done this past week. On Weds beta was >900, on Friday beta was >2100  She was scheduled to have an [place] next week in [place].  She had Cramping off and on for 2 weeks and today she started having strong cramps with heavy bleeding. She was passing multiple clots.   She continues to have heavy vaginal bleeding and mild cramping that comes and goes.   OB History    Gravida Para Term Preterm AB TAB SAB Ectopic Multiple Living   2    1  1          History reviewed. No pertinent past medical history.  History reviewed. No pertinent past surgical history.  Family History  Problem Relation Age of Onset  . Healthy Mother   . Healthy Father     Social History  Substance Use Topics  . Smoking status: Never Smoker   . Smokeless tobacco: Never Used  . Alcohol Use: Yes     Comment: rarely    Allergies:  Allergies  Allergen Reactions  . Doxycycline   . Shellfish Allergy Diarrhea, Hives and Nausea And Vomiting    Flu like symptoms    Prescriptions prior to admission  Medication Sig Dispense Refill Last Dose  . ALPRAZolam (XANAX) 0.5 MG tablet Take 1 tablet (0.5 mg total) by mouth 2 (two) times daily as needed for anxiety. 60 tablet 0 Taking  . azithromycin (ZITHROMAX) 250 MG tablet 2 po day one followed by one po days 2-5 6 tablet 0   . drospirenone-ethinyl estradiol (YAZ) 3-0.02 MG tablet Take 1 tablet by mouth daily.   Taking  . furosemide (LASIX) 40 MG tablet Take 1 tablet (40 mg total) by mouth daily. 30 tablet 3 Taking  . levothyroxine (SYNTHROID, LEVOTHROID) 50 MCG tablet Take 1  tablet (50 mcg total) by mouth daily. 30 tablet 3 Taking  . metFORMIN (GLUCOPHAGE) 500 MG tablet Take 500 mg by mouth 2 (two) times daily with a meal.     Taking  . Multiple Vitamin (MULTIVITAMIN) tablet Take 1 tablet by mouth daily.     Taking  . valACYclovir (VALTREX) 500 MG tablet Take 1 tablet (500 mg total) by mouth 2 (two) times daily. 10 tablet PRN Taking   Results for orders placed or performed during the hospital encounter of 09/24/15 (from the past 48 hour(s))  Urinalysis, Routine w reflex microscopic (not at [facility])     Status: Abnormal   Collection Time: 09/24/15  5:20 PM  Result Value Ref Range   Color, Urine RED (A) YELLOW    Comment: BIOCHEMICALS MAY BE AFFECTED BY COLOR   APPearance CLOUDY (A) CLEAR   Specific Gravity, Urine 1.025 1.005 - 1.030   pH 5.0 5.0 - 8.0   Glucose, UA NEGATIVE NEGATIVE mg/dL   Hgb urine dipstick LARGE (A) NEGATIVE   Bilirubin Urine NEGATIVE NEGATIVE   Ketones, ur 15 (A) NEGATIVE mg/dL   Protein, ur 100 (A) NEGATIVE mg/dL   Nitrite POSITIVE (A) NEGATIVE   Leukocytes, UA TRACE (A) NEGATIVE  Urine microscopic-add on     Status: None   Collection Time: 09/24/15  5:20 PM  Result Value Ref Range   Squamous Epithelial / LPF NONE SEEN NONE SEEN   WBC, UA NONE SEEN 0 - 5 WBC/hpf   RBC / HPF TOO NUMEROUS TO COUNT 0 - 5 RBC/hpf    Comment: RESULT CALLED TO, READ BACK BY AND VERIFIED WITH: NOTIFIED GAGLIARDO,A AT [id number] ON 09/24/15 BY Aquilla CORRECTED ON 01/02 AT 1759: PREVIOUSLY REPORTED AS NONE SEEN    Bacteria, UA NONE SEEN NONE SEEN  Pregnancy, urine POC     Status: Abnormal   Collection Time: 09/24/15  5:34 PM  Result Value Ref Range   Preg Test, Ur POSITIVE (A) NEGATIVE    Comment:        THE SENSITIVITY OF THIS METHODOLOGY IS >24 mIU/mL   CBC     Status: Abnormal   Collection Time: 09/24/15  6:25 PM  Result Value Ref Range   WBC 13.9 (H) 4.0 - 10.5 K/uL   RBC 4.40 3.87 - 5.11 MIL/uL   Hemoglobin 13.4 12.0 - 15.0 g/dL   HCT 39.4 36.0 -  46.0 %   MCV 89.5 78.0 - 100.0 fL   MCH 30.5 26.0 - 34.0 pg   MCHC 34.0 30.0 - 36.0 g/dL   RDW 12.6 11.5 - 15.5 %   Platelets 260 150 - 400 K/uL  ABO/Rh     Status: None (Preliminary result)   Collection Time: 09/24/15  6:25 PM  Result Value Ref Range   ABO/RH(D) O POS   hCG, quantitative, pregnancy     Status: Abnormal   Collection Time: 09/24/15  6:25 PM  Result Value Ref Range   hCG, Beta Chain, Quant, S 4642 (H) <5 mIU/mL    Comment:          GEST. AGE      CONC.  (mIU/mL)   <=1 WEEK        5 - 50     2 WEEKS       50 - 500     3 WEEKS       100 - 10,000     4 WEEKS     1,000 - 30,000     5 WEEKS     3,500 - 115,000   6-8 WEEKS     12,000 - 270,000    12 WEEKS     15,000 - 220,000        FEMALE AND NON-PREGNANT FEMALE:     LESS THAN 5 mIU/mL     US Ob Comp Less 14 Wks  09/24/2015  CLINICAL DATA:  LMP 08/17/2015. Vaginal bleeding. By LMP patient is 5 weeks 3 days. EDC by LMP is 05/23/2016. Patient is gravida 2 para 0 AB1. AMA. Quantitative beta HCG 4,642. EXAM: OBSTETRIC <14 WK [place] AND TRANSVAGINAL OB US TECHNIQUE: Both transabdominal and transvaginal ultrasound examinations were performed for complete evaluation of the gestation as well as the maternal uterus, adnexal regions, and pelvic cul-de-sac. Transvaginal technique was performed to assess early pregnancy. COMPARISON:  None applicable FINDINGS: Intrauterine gestational sac: Not seen Yolk sac:  Not seen Embryo:  Not seen Cardiac Activity: Not seen Subchorionic hemorrhage:  None applicable Maternal uterus/adnexae: The ovaries have a normal appearance. Endometrium is mildly heterogeneous and measures 1.6 cm. Multiple uterine fibroids are noted, measuring 2.6 x 1.7 x 2.2 cm, 2.8 x 1.9 x 2.4 cm (sub serosal posterior), 1.3 x 1.0 x 2.0 cm (myometrial posterior),  1.5 x 0.9 x 1.1 cm ( sub serosal posterior), and 1.1 x 0.7 x 0.9 cm. No free pelvic fluid. IMPRESSION: 1. No intrauterine or adnexal pregnancy identified. 2. Question of  completed spontaneous abortion based on the quantitative beta HCG and ultrasound findings. 3. Small amount of heterogeneous material within the endometrium raises question of retained products of conception (1.6 cm). 4. Although an ectopic pregnancy is not entirely excluded, no adnexal mass is identified. 5. Numerous uterine fibroids. Electronically Signed   By: Nolon Nations M.D.   On: 09/24/2015 19:39   US Ob Transvaginal  09/24/2015  CLINICAL DATA:  LMP 08/17/2015. Vaginal bleeding. By LMP patient is 5 weeks 3 days. EDC by LMP is 05/23/2016. Patient is gravida 2 para 0 AB1. AMA. Quantitative beta HCG 4,642. EXAM: OBSTETRIC <14 WK [place] AND TRANSVAGINAL OB US TECHNIQUE: Both transabdominal and transvaginal ultrasound examinations were performed for complete evaluation of the gestation as well as the maternal uterus, adnexal regions, and pelvic cul-de-sac. Transvaginal technique was performed to assess early pregnancy. COMPARISON:  None applicable FINDINGS: Intrauterine gestational sac: Not seen Yolk sac:  Not seen Embryo:  Not seen Cardiac Activity: Not seen Subchorionic hemorrhage:  None applicable Maternal uterus/adnexae: The ovaries have a normal appearance. Endometrium is mildly heterogeneous and measures 1.6 cm. Multiple uterine fibroids are noted, measuring 2.6 x 1.7 x 2.2 cm, 2.8 x 1.9 x 2.4 cm (sub serosal posterior), 1.3 x 1.0 x 2.0 cm (myometrial posterior), 1.5 x 0.9 x 1.1 cm ( sub serosal posterior), and 1.1 x 0.7 x 0.9 cm. No free pelvic fluid. IMPRESSION: 1. No intrauterine or adnexal pregnancy identified. 2. Question of completed spontaneous abortion based on the quantitative beta HCG and ultrasound findings. 3. Small amount of heterogeneous material within the endometrium raises question of retained products of conception (1.6 cm). 4. Although an ectopic pregnancy is not entirely excluded, no adnexal mass is identified. 5. Numerous uterine fibroids. Electronically Signed   By: Nolon Nations  M.D.   On: 09/24/2015 19:39    Review of Systems  Gastrointestinal: Positive for abdominal pain. Negative for nausea and vomiting.  Genitourinary: Negative for dysuria, urgency, frequency and flank pain.   Physical Exam   Blood pressure 141/80, pulse 91, temperature 98.3 F (36.8 C), temperature source Oral, resp. rate 18, last menstrual period 08/17/2015.  Physical Exam  Constitutional: She is oriented to person, place, and time. She appears well-developed and well-nourished. No distress.  HENT:  Head: Normocephalic.  Eyes: Pupils are equal, round, and reactive to light.  Neck: Neck supple.  GI: Soft.  Genitourinary:  Speculum exam: Vagina - Small amount of dark red blood in the vagina.  Cervix - No contact bleeding, no active bleeding from the os.  Bimanual exam: Cervix closed Uterus non tender, slightly enlarged  Adnexa non tender, no masses bilaterally Chaperone present for exam.  Musculoskeletal: Normal range of motion.  Neurological: She is alert and oriented to person, place, and time.  Skin: Skin is warm. She is not diaphoretic.  Psychiatric: Her behavior is normal.    MAU Course  Procedures  None  MDM Urine culture pending; patient is asymptomatic.  O positive blood type.   Assessment and Plan   A:  1. Pregnancy of unknown anatomic location   2. Vaginal bleeding in pregnancy, first trimester   3. Abdominal pain in pregnancy    P:  Discharge home in stable condition Follow up with Dr. Maurine Cane; in [facility] Call Dr. Newt Minion office as needed Urine  culture pending; + nitrites with large blood, patient is asymptomatic. Will call if urine culture is positive.  Return to MAU as needed, if symptoms worsen Bleeding precautions.     Lezlie Lye, NP 09/24/2015 8:12 PM

## 2015-09-25 LAB — CULTURE, OB URINE
Culture: NO GROWTH
Special Requests: NORMAL

## 2015-11-30 ENCOUNTER — Telehealth: Payer: Self-pay | Admitting: Internal Medicine

## 2015-11-30 ENCOUNTER — Other Ambulatory Visit: Payer: Self-pay

## 2015-11-30 MED ORDER — LEVOTHYROXINE SODIUM 75 MCG PO TABS: 75.0000 ug | ORAL_TABLET | Freq: Every day | ORAL | Status: DC

## 2015-11-30 NOTE — Telephone Encounter (Signed)
37mcg levothyroxine sent to pharmacy

## 2015-11-30 NOTE — Telephone Encounter (Signed)
She's no longer with the physician in [place].   She had TSH levels sent to you for review earlier in the week.  She is out of thyroid meds as of today and needs a refill.  They had changed the dosage.  The NEW dosage is 75 mcg.  That level and change in dosage was from October.    [facility].

## 2016-01-30 ENCOUNTER — Other Ambulatory Visit: Payer: Self-pay | Admitting: Internal Medicine

## 2016-01-30 NOTE — Telephone Encounter (Signed)
No recent TSH and requesting refill. Can we please do TSH and OV?

## 2016-02-01 NOTE — Telephone Encounter (Signed)
Left message for patient; pharmacy called office and was advised to ask patient to call our office.

## 2016-02-19 ENCOUNTER — Encounter: Payer: Self-pay | Admitting: Internal Medicine

## 2016-02-19 ENCOUNTER — Ambulatory Visit (INDEPENDENT_AMBULATORY_CARE_PROVIDER_SITE_OTHER): Payer: BC Managed Care – PPO | Admitting: Internal Medicine

## 2016-02-19 VITALS — BP 136/86 | HR 86 | Temp 98.7°F | Resp 18 | Ht 65.0 in | Wt 200.0 lb

## 2016-02-19 DIAGNOSIS — J209 Acute bronchitis, unspecified: Secondary | ICD-10-CM

## 2016-02-19 DIAGNOSIS — H6693 Otitis media, unspecified, bilateral: Secondary | ICD-10-CM

## 2016-02-19 DIAGNOSIS — J029 Acute pharyngitis, unspecified: Secondary | ICD-10-CM

## 2016-02-19 LAB — POCT RAPID STREP A (OFFICE): Rapid Strep A Screen: NEGATIVE

## 2016-02-19 MED ORDER — AMOXICILLIN-POT CLAVULANATE 500-125 MG PO TABS: 1.0000 | ORAL_TABLET | Freq: Three times a day (TID) | ORAL | Status: DC

## 2016-02-19 NOTE — Patient Instructions (Addendum)
Augmentin 500 mg 3 times daily for 10 days. Call if not better in 7-10 days or sooner if worse.

## 2016-02-19 NOTE — Progress Notes (Signed)
   Subjective:    Patient ID: Danielle Doyle, female    DOB: 1975-12-30, 40 y.o.   MRN: [id number]  HPI 40 year old Female who has had URI symptoms for one week. Initially started as sore throat, fever, myalgias. Has persistent with low-grade fever. Has developed congested cough and ear congestion. Patient says she's not trying to conceive at this point in time.    Review of Systems     Objective:   Physical Exam Pharynx is injected without exudate. TMs are dull and pink bilaterally. Slightly retracted bilaterally. Neck is supple without adenopathy. She has a deep congested cough. Chest is clear to auscultation without rales or wheezing. Rapid strep screen is negative.       Assessment & Plan:  Acute bilateral otitis media  Acute pharyngitis  Acute bronchitis  Plan: Augmentin 500 mg 3 times daily for 10 days. May take over-the-counter cough syrup.

## 2016-02-24 ENCOUNTER — Other Ambulatory Visit: Payer: Self-pay | Admitting: Internal Medicine

## 2016-02-24 NOTE — Telephone Encounter (Signed)
No TSH here since 2015. Please call her

## 2016-02-25 ENCOUNTER — Other Ambulatory Visit: Payer: Self-pay | Admitting: Obstetrics and Gynecology

## 2016-02-25 DIAGNOSIS — Z1231 Encounter for screening mammogram for malignant neoplasm of breast: Secondary | ICD-10-CM

## 2016-03-06 ENCOUNTER — Ambulatory Visit
Admission: RE | Admit: 2016-03-06 | Discharge: 2016-03-06 | Disposition: A | Discharge status: Home / Self Care | Payer: BC Managed Care – PPO | Source: Ambulatory Visit | Attending: Obstetrics and Gynecology | Admitting: Obstetrics and Gynecology

## 2016-03-06 DIAGNOSIS — Z1231 Encounter for screening mammogram for malignant neoplasm of breast: Secondary | ICD-10-CM

## 2016-03-12 ENCOUNTER — Other Ambulatory Visit: Payer: Self-pay | Admitting: Obstetrics and Gynecology

## 2016-03-12 DIAGNOSIS — R928 Other abnormal and inconclusive findings on diagnostic imaging of breast: Secondary | ICD-10-CM

## 2016-03-14 ENCOUNTER — Ambulatory Visit
Admission: RE | Admit: 2016-03-14 | Discharge: 2016-03-14 | Disposition: A | Discharge status: Home / Self Care | Payer: BC Managed Care – PPO | Source: Ambulatory Visit | Attending: Obstetrics and Gynecology | Admitting: Obstetrics and Gynecology

## 2016-03-14 DIAGNOSIS — R928 Other abnormal and inconclusive findings on diagnostic imaging of breast: Secondary | ICD-10-CM

## 2016-04-11 ENCOUNTER — Other Ambulatory Visit: Payer: BC Managed Care – PPO | Admitting: Internal Medicine

## 2016-04-11 DIAGNOSIS — E119 Type 2 diabetes mellitus without complications: Secondary | ICD-10-CM

## 2016-04-11 DIAGNOSIS — Z130 Encounter for screening for diseases of the blood and blood-forming organs and certain disorders involving the immune mechanism: Secondary | ICD-10-CM

## 2016-04-11 DIAGNOSIS — E785 Hyperlipidemia, unspecified: Secondary | ICD-10-CM

## 2016-04-11 DIAGNOSIS — Z0000 Encounter for general adult medical examination without abnormal findings: Secondary | ICD-10-CM

## 2016-04-11 DIAGNOSIS — Z1329 Encounter for screening for other suspected endocrine disorder: Secondary | ICD-10-CM

## 2016-04-11 DIAGNOSIS — Z1321 Encounter for screening for nutritional disorder: Secondary | ICD-10-CM

## 2016-04-11 LAB — CBC WITH DIFFERENTIAL/PLATELET
BASOS PCT: 0 %
Basophils Absolute: 0 cells/uL (ref 0–200)
EOS PCT: 8 %
Eosinophils Absolute: 568 cells/uL — ABNORMAL HIGH (ref 15–500)
HCT: 38.6 % (ref 35.0–45.0)
Hemoglobin: 13 g/dL (ref 11.7–15.5)
Lymphocytes Relative: 30 %
Lymphs Abs: 2130 cells/uL (ref 850–3900)
MCH: 29.5 pg (ref 27.0–33.0)
MCHC: 33.7 g/dL (ref 32.0–36.0)
MCV: 87.5 fL (ref 80.0–100.0)
MONOS PCT: 8 %
MPV: 10.2 fL (ref 7.5–12.5)
Monocytes Absolute: 568 cells/uL (ref 200–950)
NEUTROS ABS: 3834 {cells}/uL (ref 1500–7800)
Neutrophils Relative %: 54 %
PLATELETS: 216 10*3/uL (ref 140–400)
RBC: 4.41 MIL/uL (ref 3.80–5.10)
RDW: 13.3 % (ref 11.0–15.0)
WBC: 7.1 10*3/uL (ref 3.8–10.8)

## 2016-04-11 LAB — COMPLETE METABOLIC PANEL WITH GFR
ALT: 11 U/L (ref 6–29)
AST: 15 U/L (ref 10–30)
Albumin: 4 g/dL (ref 3.6–5.1)
Alkaline Phosphatase: 51 U/L (ref 33–115)
BILIRUBIN TOTAL: 0.6 mg/dL (ref 0.2–1.2)
BUN: 9 mg/dL (ref 7–25)
CHLORIDE: 104 mmol/L (ref 98–110)
CO2: 22 mmol/L (ref 20–31)
CREATININE: 0.71 mg/dL (ref 0.50–1.10)
Calcium: 8.7 mg/dL (ref 8.6–10.2)
GFR, Est African American: >89 mL/min (ref >=60)
GLUCOSE: 75 mg/dL (ref 65–99)
Potassium: 3.9 mmol/L (ref 3.5–5.3)
SODIUM: 139 mmol/L (ref 135–146)
TOTAL PROTEIN: 6.6 g/dL (ref 6.1–8.1)

## 2016-04-11 LAB — LIPID PANEL
Cholesterol: 162 mg/dL (ref 125–200)
HDL: 31 mg/dL — AB (ref >=46)
Total CHOL/HDL Ratio: 5.2 Ratio — ABNORMAL HIGH (ref <=5.0)
Triglycerides: 557 mg/dL — ABNORMAL HIGH (ref <150)

## 2016-04-11 LAB — HEMOGLOBIN A1C
Hgb A1c MFr Bld: 5.3 % (ref <5.7)
MEAN PLASMA GLUCOSE: 105 mg/dL

## 2016-04-11 LAB — TSH: TSH: 1.98 m[IU]/L

## 2016-04-12 LAB — VITAMIN D 25 HYDROXY (VIT D DEFICIENCY, FRACTURES): Vit D, 25-Hydroxy: 15 ng/mL — ABNORMAL LOW (ref 30–100)

## 2016-04-12 LAB — MICROALBUMIN, URINE: Microalb, Ur: 0.5 mg/dL

## 2016-04-15 ENCOUNTER — Ambulatory Visit (INDEPENDENT_AMBULATORY_CARE_PROVIDER_SITE_OTHER): Payer: BC Managed Care – PPO | Admitting: Internal Medicine

## 2016-04-15 VITALS — Ht 65.0 in | Wt 201.5 lb

## 2016-04-15 DIAGNOSIS — E781 Pure hyperglyceridemia: Secondary | ICD-10-CM

## 2016-04-15 DIAGNOSIS — Z0000 Encounter for general adult medical examination without abnormal findings: Secondary | ICD-10-CM | POA: Diagnosis not present

## 2016-04-15 DIAGNOSIS — Z9049 Acquired absence of other specified parts of digestive tract: Secondary | ICD-10-CM

## 2016-04-15 DIAGNOSIS — Z803 Family history of malignant neoplasm of breast: Secondary | ICD-10-CM

## 2016-04-15 DIAGNOSIS — Z8742 Personal history of other diseases of the female genital tract: Secondary | ICD-10-CM

## 2016-04-15 DIAGNOSIS — Z91013 Allergy to seafood: Secondary | ICD-10-CM

## 2016-04-15 DIAGNOSIS — E039 Hypothyroidism, unspecified: Secondary | ICD-10-CM | POA: Diagnosis not present

## 2016-04-15 DIAGNOSIS — E669 Obesity, unspecified: Secondary | ICD-10-CM

## 2016-04-15 DIAGNOSIS — E559 Vitamin D deficiency, unspecified: Secondary | ICD-10-CM

## 2016-04-15 DIAGNOSIS — E739 Lactose intolerance, unspecified: Secondary | ICD-10-CM | POA: Diagnosis not present

## 2016-04-15 LAB — POCT URINALYSIS DIPSTICK
Bilirubin, UA: NEGATIVE
Glucose, UA: NEGATIVE
Ketones, UA: NEGATIVE
Leukocytes, UA: NEGATIVE
Nitrite, UA: NEGATIVE
PROTEIN UA: NEGATIVE
SPEC GRAV UA: 1.01
UROBILINOGEN UA: 0.2
pH, UA: 5

## 2016-04-15 MED ORDER — VITAMIN D (ERGOCALCIFEROL) 1.25 MG (50000 UNIT) PO CAPS: 50000.0000 [IU] | ORAL_CAPSULE | ORAL | Status: DC

## 2016-04-15 MED ORDER — FENOFIBRATE 160 MG PO TABS: 160.0000 mg | ORAL_TABLET | Freq: Every day | ORAL | Status: DC

## 2016-04-16 ENCOUNTER — Telehealth: Payer: Self-pay | Admitting: Internal Medicine

## 2016-04-16 MED ORDER — ERGOCALCIFEROL 1.25 MG (50000 UT) PO CAPS
50000.0000 [IU] | ORAL_CAPSULE | ORAL | 0 refills | Status: DC
Start: 2016-04-16 — End: 2017-08-06

## 2016-04-16 MED ORDER — LEVOTHYROXINE SODIUM 75 MCG PO TABS: ORAL_TABLET | ORAL | 0 refills | Status: DC

## 2016-04-16 MED ORDER — FENOFIBRATE 160 MG PO TABS: 160.0000 mg | ORAL_TABLET | Freq: Every day | ORAL | 1 refills | Status: DC

## 2016-04-16 NOTE — Telephone Encounter (Signed)
Patient states that she went to pick up her Rx's today and they had not been sent to her pharmacy.  Can we send her meds to her pharmacy from her visit from yesterday?  Looks like maybe she was supposed to have Fenofibrate and Vitamin D sent to her pharmacy yesterday, but there is not a pharmacy receipt where they actually went yesterday.  Not sure what happened there, but she says they didn't have them at the pharmacy when she went to pick them up.    She also wanted to let [place] know that she is now back on birth control.  She is on Yasmin.  So we can put that in her chart too please.    Thank you.

## 2016-04-16 NOTE — Progress Notes (Signed)
   Subjective:    Patient ID: Danielle Doyle, female    DOB: 07-08-1976, 40 y.o.   MRN: [id number]  HPI Pleasant 40 year old Female in today for health maintenance exam and evaluation of medical issues.  History of allergic rhinitis, shrimp allergy, GE reflux, hypothyroidism, herpes simplex type I, polycystic ovary syndrome. Had H pylori infection February 2010. Laparoscopic cholecystectomy July 2004. Ectopic pregnancy August 2010.  No known drug allergies  Seen by Dr. Ardis Hughs 2012 regarding abdominal bloating and upper abdominal pain. Was felt to have possible lactose intolerance. He treated her with Nexium instead of Dexilant.  History of removal of endometrial polyps 2003, 2004, 2009 and 2010.    Says she wants to have allergy evaluation regarding seafood allergy. Have given her name of allergist to contact.   Family history: Both parents with history of hypertriglyceridemia. Father with history of MI in his 66s. Mother healthy except for elevated triglycerides. Half sister with history of breast cancer. Family history of breast cancer in maternal aunt.  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 She is married. This is her second marriage. No children. Has undergone infertility treatments in the past. Suffered a miscarriage January 2017. Is considering adoption. Does not smoke. Social alcohol consumption.    Review of Systems  Constitutional: Negative.   All other systems reviewed and are negative.      Objective:   Physical Exam  Constitutional: She is oriented to person, place, and time. She appears well-developed and well-nourished. No distress.  HENT:  Head: Normocephalic and atraumatic.  Right Ear: External ear normal.  Left Ear: External ear normal.  Mouth/Throat: Oropharynx is clear and moist.  Eyes: Conjunctivae and EOM are normal. Pupils are equal, round, and reactive to light.  Right eye exhibits no discharge. Left eye exhibits no discharge. No scleral icterus.  Neck: Neck supple. No JVD present. No thyromegaly present.  Cardiovascular: Normal rate, regular rhythm, normal heart sounds and intact distal pulses.   No murmur heard. Pulmonary/Chest: Effort normal and breath sounds normal. No respiratory distress. She has no wheezes. She has no rales.  Abdominal: Soft. Bowel sounds are normal. She exhibits no distension and no mass. There is no tenderness. There is no rebound and no guarding.  Genitourinary:  Genitourinary Comments: Deferred to GYN  Musculoskeletal: She exhibits no edema.  Lymphadenopathy:    She has no cervical adenopathy.  Neurological: She is alert and oriented to person, place, and time. She has normal reflexes. No cranial nerve deficit. Coordination normal.  Skin: Skin is warm and dry. No rash noted. She is not diaphoretic.  Psychiatric: She has a normal mood and affect. Her behavior is normal. Judgment and thought content normal.  Vitals reviewed.         Assessment & Plan:  Hypertriglyceridemia-likely inherited. Start TriCor 160 mg daily and follow-up in 3 months.  Vitamin D deficiency 50,000 units Drisdol weekly for 12 weeks then 2000 units daily. Recheck vitamin D level and lipid panel is rechecked in 3 months.  Hypothyroidism-TSH stable on current regimen.  Infertility-currently on Yasmin per GYN. Considering adoption  Shrimp allergy-may call Dr. Donneta Romberg regarding appointment for evaluation  Family history of breast cancer  Probable lactose intolerance with irritable bowel symptoms.  Plan: Return in 3 months for office visit fasting lipid panel, vitamin D level

## 2016-04-16 NOTE — Patient Instructions (Signed)
Start TriCor 160 mg daily. Follow-up in 3 months with fasting lipid panel and office visit. Take 50,000 units vitamin D3 weekly for 12 weeks and we will check vitamin D level at that same time. It was a pleasure to see you today.

## 2016-04-16 NOTE — Telephone Encounter (Signed)
This is been resubmitted

## 2016-04-17 ENCOUNTER — Encounter: Payer: Self-pay | Admitting: Internal Medicine

## 2016-07-13 ENCOUNTER — Other Ambulatory Visit: Payer: Self-pay | Admitting: Internal Medicine

## 2016-07-15 ENCOUNTER — Other Ambulatory Visit: Payer: BC Managed Care – PPO | Admitting: Internal Medicine

## 2016-07-17 ENCOUNTER — Ambulatory Visit: Payer: BC Managed Care – PPO | Admitting: Internal Medicine

## 2016-09-17 ENCOUNTER — Ambulatory Visit (HOSPITAL_COMMUNITY): Admit: 2016-09-17 | Payer: BC Managed Care – PPO | Admitting: Obstetrics and Gynecology

## 2016-09-17 ENCOUNTER — Encounter (HOSPITAL_COMMUNITY): Payer: Self-pay

## 2016-09-17 SURGERY — HYSTERECTOMY, VAGINAL, LAPAROSCOPY-ASSISTED, WITH SALPINGECTOMY
Anesthesia: General | Laterality: Bilateral

## 2016-10-22 ENCOUNTER — Other Ambulatory Visit: Payer: Self-pay | Admitting: Internal Medicine

## 2016-10-22 NOTE — Telephone Encounter (Signed)
Pt was to have follow up on this med in 3 months with lipid panel and OV. Is she taking it?

## 2016-10-27 NOTE — Telephone Encounter (Signed)
LMTRC

## 2016-11-20 ENCOUNTER — Other Ambulatory Visit: Payer: Self-pay

## 2016-12-14 ENCOUNTER — Other Ambulatory Visit: Payer: Self-pay | Admitting: Internal Medicine

## 2016-12-14 NOTE — Telephone Encounter (Signed)
Due for CPE late July. Please call to schedule before refilling until then

## 2016-12-15 NOTE — Telephone Encounter (Signed)
Left message to return call 

## 2016-12-17 NOTE — Telephone Encounter (Signed)
Received yet another refill request from [facility] regarding Synthroid refill. Patient will need to call and book a physical exam for July before we can refill this medication. Pharmacy was sent this same notification by fax today 12/17/2016. Patient was left a voice mail to call our office on 12/14/2016. We frequently have this issue with booking appointments with this patient. We'll ask her to come once yearly.

## 2016-12-18 NOTE — Telephone Encounter (Signed)
Left detailed message. Patient needs to call our office to book CPE before we can refill this medication.

## 2017-01-12 ENCOUNTER — Ambulatory Visit (INDEPENDENT_AMBULATORY_CARE_PROVIDER_SITE_OTHER): Payer: BC Managed Care – PPO | Admitting: Internal Medicine

## 2017-01-12 ENCOUNTER — Encounter: Payer: Self-pay | Admitting: Internal Medicine

## 2017-01-12 ENCOUNTER — Telehealth: Payer: Self-pay | Admitting: Internal Medicine

## 2017-01-12 VITALS — BP 110/82 | HR 71 | Temp 98.8°F | Wt 202.0 lb

## 2017-01-12 DIAGNOSIS — E039 Hypothyroidism, unspecified: Secondary | ICD-10-CM | POA: Diagnosis not present

## 2017-01-12 DIAGNOSIS — J0100 Acute maxillary sinusitis, unspecified: Secondary | ICD-10-CM | POA: Diagnosis not present

## 2017-01-12 DIAGNOSIS — H1031 Unspecified acute conjunctivitis, right eye: Secondary | ICD-10-CM | POA: Diagnosis not present

## 2017-01-12 DIAGNOSIS — E781 Pure hyperglyceridemia: Secondary | ICD-10-CM | POA: Diagnosis not present

## 2017-01-12 DIAGNOSIS — H6691 Otitis media, unspecified, right ear: Secondary | ICD-10-CM

## 2017-01-12 MED ORDER — AMOXICILLIN-POT CLAVULANATE 500-125 MG PO TABS: 1.0000 | ORAL_TABLET | Freq: Three times a day (TID) | ORAL | 0 refills | Status: DC

## 2017-01-12 MED ORDER — FENOFIBRATE 160 MG PO TABS
160.0000 mg | ORAL_TABLET | Freq: Every day | ORAL | 1 refills | Status: DC
Start: 2017-01-12 — End: 2017-07-10

## 2017-01-12 MED ORDER — OFLOXACIN 0.3 % OP SOLN: 2.0000 [drp] | Freq: Four times a day (QID) | OPHTHALMIC | 0 refills | Status: DC

## 2017-01-12 NOTE — Progress Notes (Signed)
   Subjective:    Patient ID: Danielle Doyle, female    DOB: [birth year], 41 y.o.   MRN: [id number]  HPI 41 year old Female had flu like illness around 3rd week in March. Was [place] college kids [place]. Sinus symptoms with discolored nasal drainage. Ear pain. Eye puffiness. No fever. Has fatigue.  Father with hx Hypertriglyceridemia with hx MI.Was placed on TriCor several months ago and did not follow-up after 3 months. She is also due for check on hypothyroidism. She is not fasting today and we will schedule lab work at another time. She needs to restart TriCor.  Has had malaise and fatigue. Thought she was getting stye in right eye but it never developed. Has had some drainage from right eye.  Review of Systems see above     Objective:   Physical Exam  Right TM is red and full. Left TM is clear. Pharynx slightly injected. She sounds nasally congested. Right conjunctiva swollen and very red. Left conjunctiva slightly red. Neck is supple. Chest clear to auscultation.      Assessment & Plan:  Acute maxillary sinusitis  Acute right otitis media  Acute conjunctivitis right eye  Plan: Ocuflox ophthalmic drops 2 drops in each eye 4 times a day for 5 days. Augmentin 500 mg 3 times daily for 10 days. Restart TriCor 160 mg daily and follow-up in one month. She will need TSH and fasting lipid panel at that time.

## 2017-01-12 NOTE — Patient Instructions (Signed)
To have fasting lab work lipid panel and TSH in 4 weeks. Restart TriCor.  For sinusitis, Augmentin 500 mg 3 times daily for 10 days. For conjunctivitis, Ocuflox ophthalmic drops 2 drops in each eye 4 times a day for 5 days.

## 2017-01-12 NOTE — Telephone Encounter (Signed)
Patient called for add-on appointment and provided appointment for today @ 11:45 for sinus infection.

## 2017-01-21 IMAGING — MG 2D DIGITAL SCREENING BILATERAL MAMMOGRAM WITH CAD AND ADJUNCT TO
8 of 12 series · 8 of 28 positions shown · non-contrast
Comparison: Previous exam(s).

CLINICAL DATA: Screening.

EXAM:
2D DIGITAL SCREENING BILATERAL MAMMOGRAM WITH CAD AND ADJUNCT TOMO

[R MLO synth-2D]
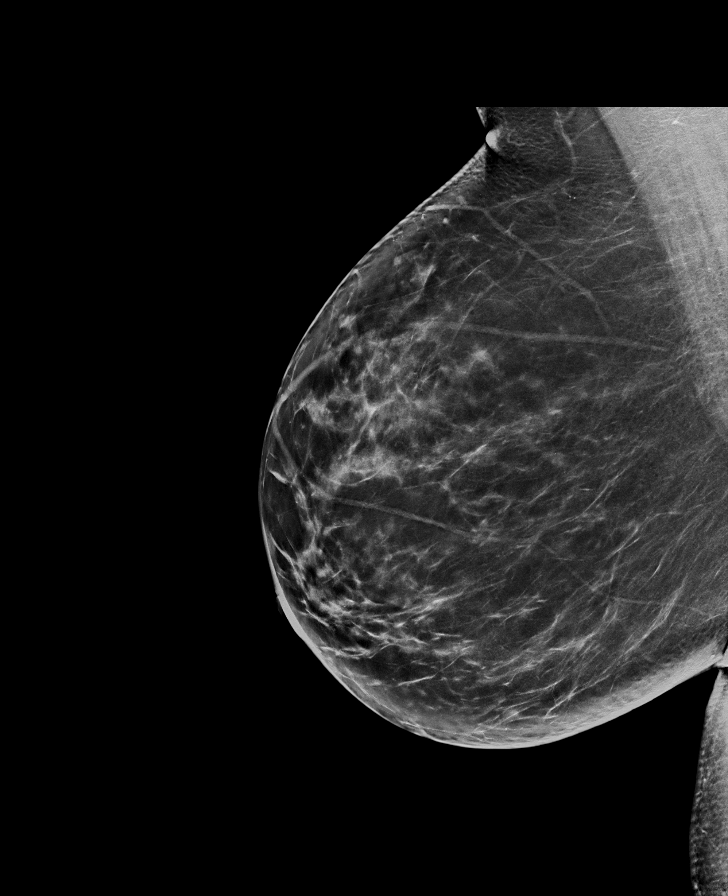

[R CC]
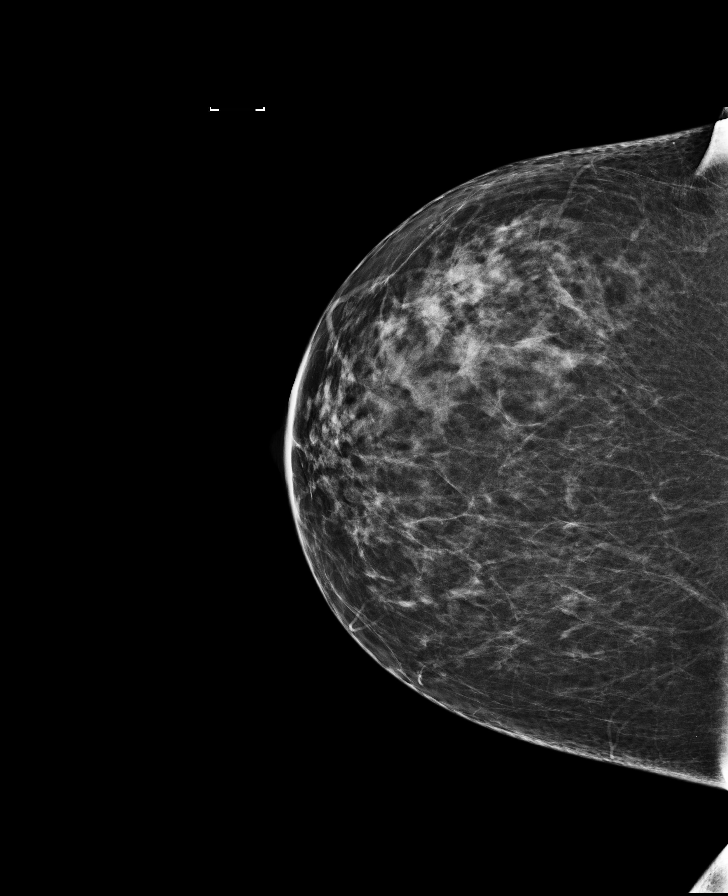

[L CC]
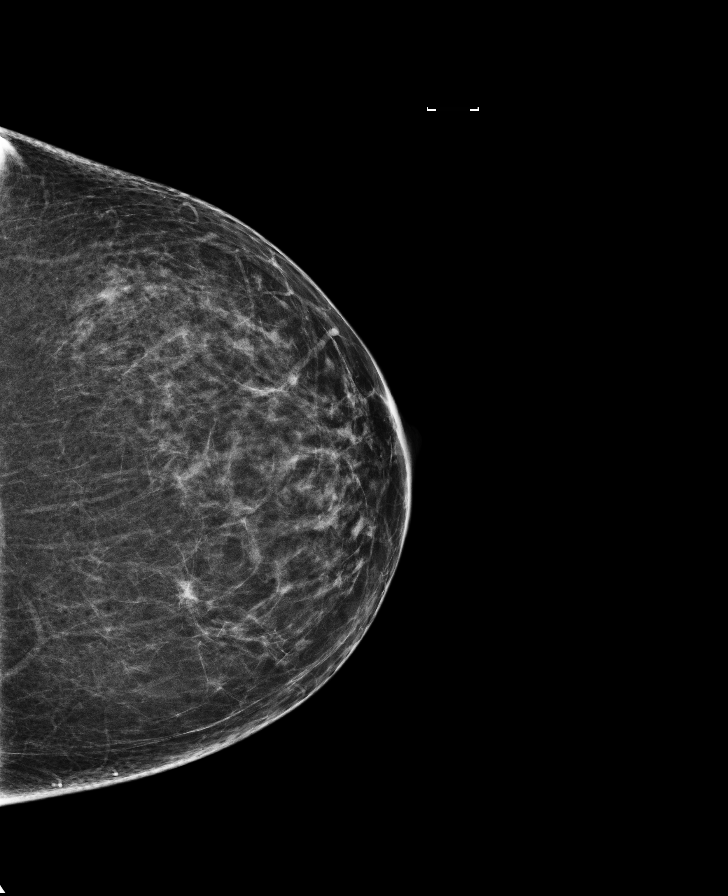

[L CC synth-2D]
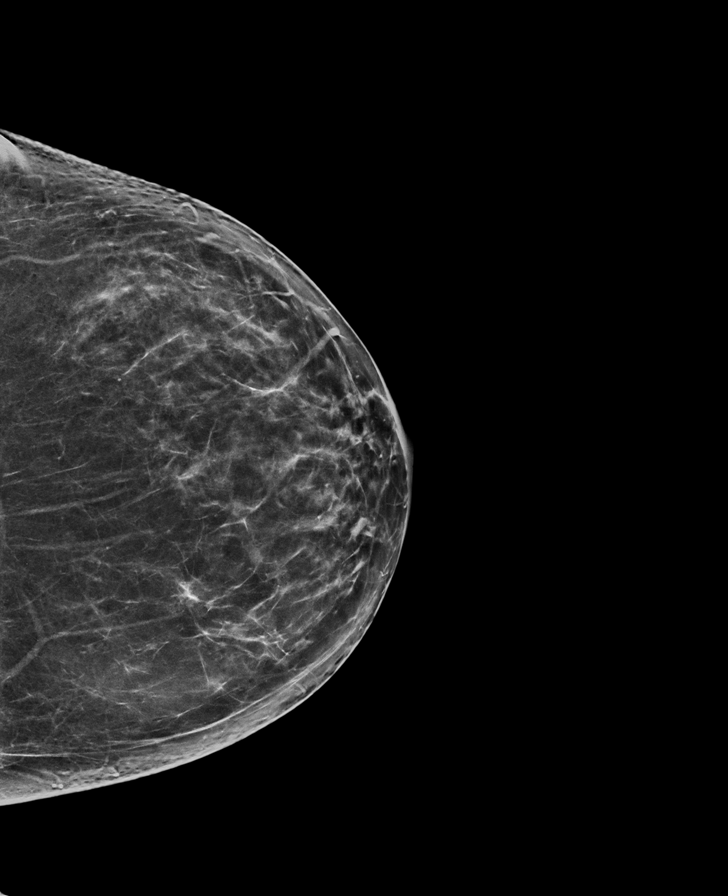

[L MLO]
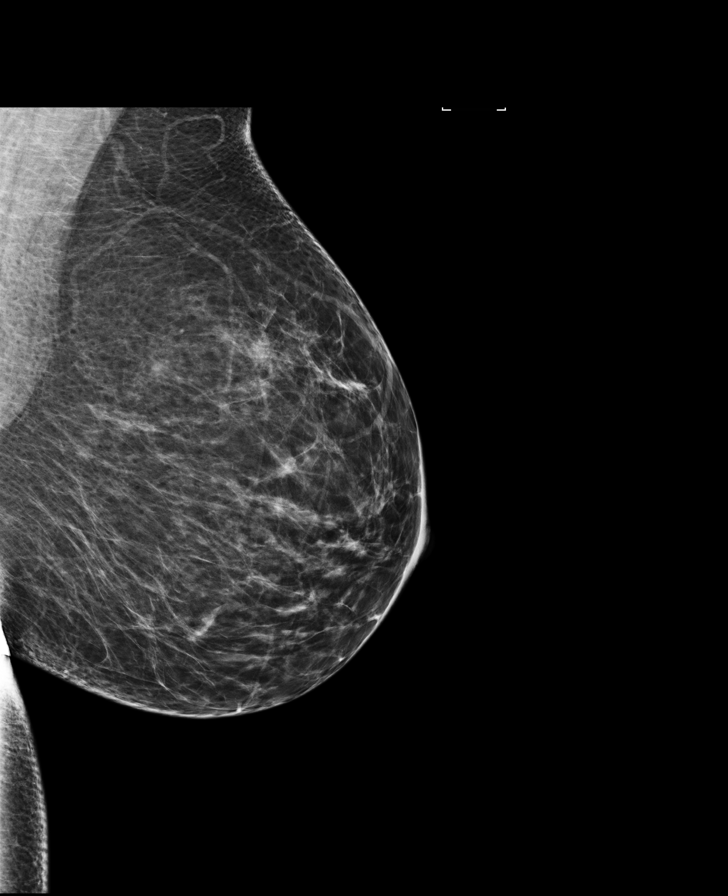

[R CC synth-2D]
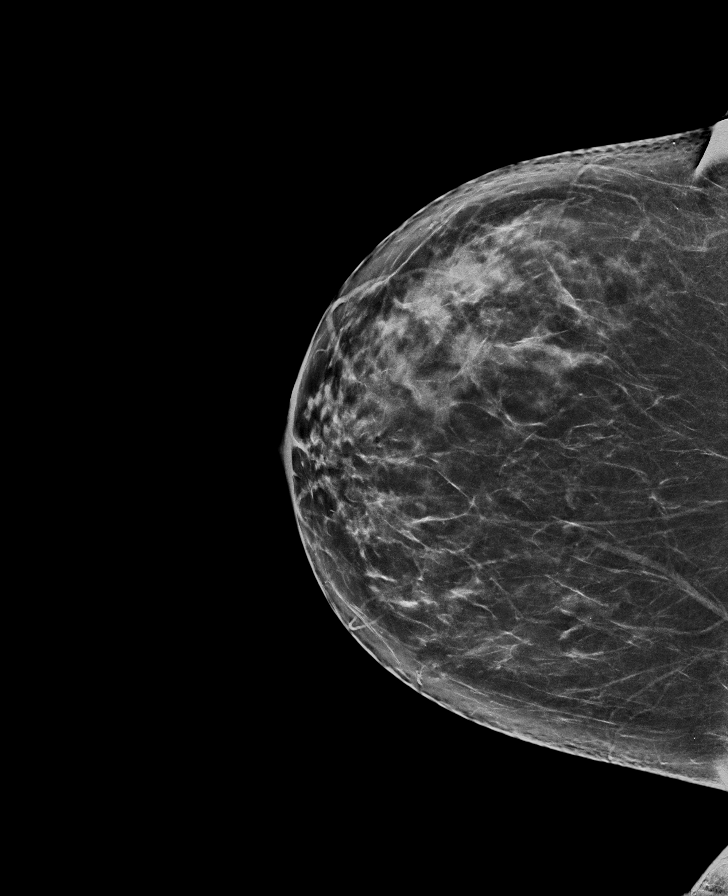

[L MLO synth-2D]
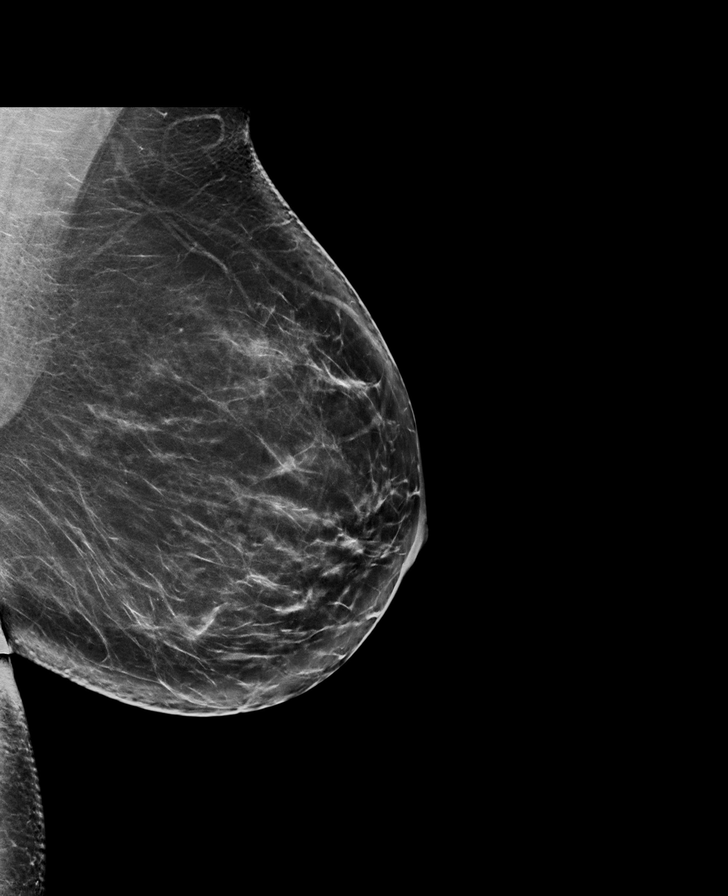

[R MLO]
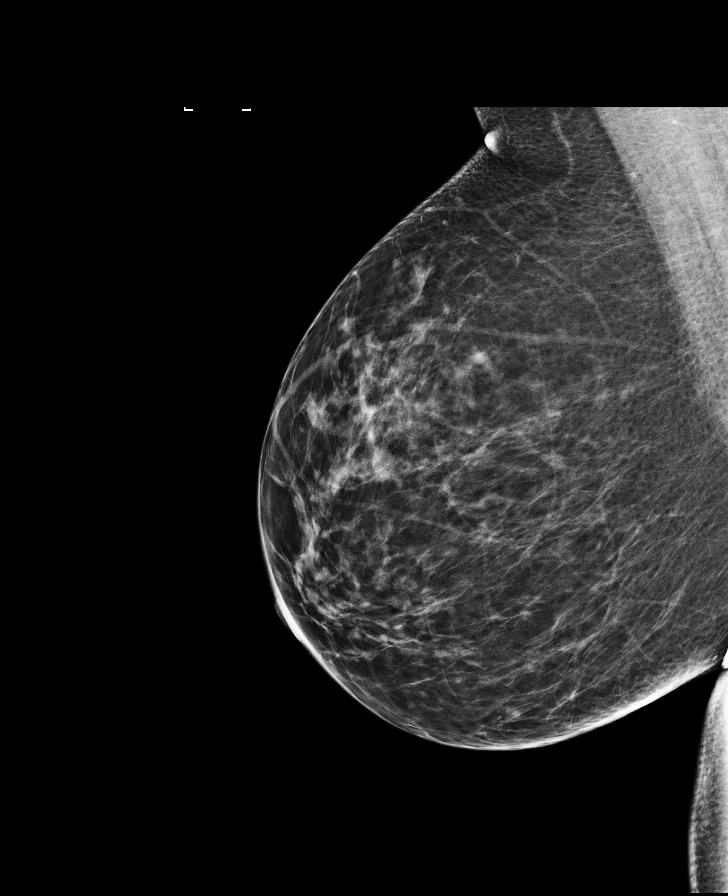

[8 of 28 positions shown; findings below may reference images not displayed]

ACR Breast Density Category b: There are scattered areas of
fibroglandular density.
FINDINGS: In the left breast, a possible mass warrants further evaluation. In
the right breast, no findings suspicious for malignancy.

Images were processed with CAD.
IMPRESSION: Further evaluation is suggested for possible mass in the left
breast.

RECOMMENDATION:
Diagnostic mammogram and possibly ultrasound of the left breast.
(Code:[id number])

The patient will be contacted regarding the findings, and additional
imaging will be scheduled.

BI-RADS CATEGORY  0: Incomplete. Need additional imaging evaluation
and/or prior mammograms for comparison.

## 2017-01-22 ENCOUNTER — Telehealth: Payer: Self-pay | Admitting: Internal Medicine

## 2017-01-22 NOTE — Telephone Encounter (Signed)
Call in Diflucan 150 mg tablet with one refill

## 2017-01-22 NOTE — Telephone Encounter (Signed)
Developed a yeast infection from the antibiotics that she took last week.  Requesting something for a yeast infection.  Would like to know if we could call in some Diflucan for her?    [facility].    Best # for contact:  [phone]

## 2017-01-22 NOTE — Telephone Encounter (Signed)
[facility] @ [phone];  Diflucan 150 mg, take 1 tablet daily.  1 refill.   Called patient and left message on voicemail @ [phone].

## 2017-01-23 MED ORDER — FLUCONAZOLE 150 MG PO TABS: 150.0000 mg | ORAL_TABLET | Freq: Once | ORAL | 1 refills | Status: AC

## 2017-01-23 NOTE — Addendum Note (Signed)
Addended by: Inocencio Homes on: 01/23/2017 08:46 AM   Modules accepted: Orders

## 2017-02-10 ENCOUNTER — Other Ambulatory Visit: Payer: BC Managed Care – PPO | Admitting: Internal Medicine

## 2017-02-10 DIAGNOSIS — E039 Hypothyroidism, unspecified: Secondary | ICD-10-CM

## 2017-02-10 DIAGNOSIS — E781 Pure hyperglyceridemia: Secondary | ICD-10-CM

## 2017-02-10 LAB — LIPID PANEL
CHOLESTEROL: 196 mg/dL (ref <200)
HDL: 44 mg/dL — ABNORMAL LOW (ref >50)
LDL Cholesterol: 112 mg/dL — ABNORMAL HIGH (ref <100)
Total CHOL/HDL Ratio: 4.5 Ratio (ref <5.0)
Triglycerides: 202 mg/dL — ABNORMAL HIGH (ref <150)
VLDL: 40 mg/dL — ABNORMAL HIGH (ref <30)

## 2017-02-10 NOTE — Progress Notes (Signed)
Labs drawn

## 2017-02-11 LAB — TSH: TSH: 1.66 mIU/L

## 2017-02-25 ENCOUNTER — Other Ambulatory Visit: Payer: Self-pay | Admitting: Internal Medicine

## 2017-02-25 NOTE — Telephone Encounter (Signed)
Needs to book PE without Pap after July 25th. Can refill until then

## 2017-02-26 NOTE — Telephone Encounter (Signed)
LMTCB

## 2017-02-27 NOTE — Telephone Encounter (Signed)
LMTCB

## 2017-02-27 NOTE — Telephone Encounter (Signed)
Per lab visit in may pt was told to have a PE in November. That has been scheduled and rx sent to last until then

## 2017-06-09 ENCOUNTER — Other Ambulatory Visit: Payer: Self-pay | Admitting: Obstetrics and Gynecology

## 2017-06-09 DIAGNOSIS — Z1231 Encounter for screening mammogram for malignant neoplasm of breast: Secondary | ICD-10-CM

## 2017-06-12 ENCOUNTER — Ambulatory Visit
Admission: RE | Admit: 2017-06-12 | Discharge: 2017-06-12 | Disposition: A | Discharge status: Home / Self Care | Payer: BC Managed Care – PPO | Source: Ambulatory Visit | Attending: Obstetrics and Gynecology | Admitting: Obstetrics and Gynecology

## 2017-06-12 DIAGNOSIS — Z1231 Encounter for screening mammogram for malignant neoplasm of breast: Secondary | ICD-10-CM

## 2017-07-10 ENCOUNTER — Other Ambulatory Visit: Payer: Self-pay | Admitting: Internal Medicine

## 2017-07-29 ENCOUNTER — Other Ambulatory Visit: Payer: Self-pay | Admitting: Internal Medicine

## 2017-07-29 DIAGNOSIS — N63 Unspecified lump in unspecified breast: Secondary | ICD-10-CM

## 2017-07-31 ENCOUNTER — Other Ambulatory Visit: Payer: Self-pay | Admitting: Internal Medicine

## 2017-07-31 DIAGNOSIS — N63 Unspecified lump in unspecified breast: Secondary | ICD-10-CM

## 2017-08-03 ENCOUNTER — Other Ambulatory Visit: Payer: BC Managed Care – PPO | Admitting: Internal Medicine

## 2017-08-03 DIAGNOSIS — Z0000 Encounter for general adult medical examination without abnormal findings: Secondary | ICD-10-CM

## 2017-08-03 DIAGNOSIS — Z13220 Encounter for screening for lipoid disorders: Secondary | ICD-10-CM

## 2017-08-03 DIAGNOSIS — E039 Hypothyroidism, unspecified: Secondary | ICD-10-CM

## 2017-08-03 DIAGNOSIS — Z1321 Encounter for screening for nutritional disorder: Secondary | ICD-10-CM

## 2017-08-03 DIAGNOSIS — Z1329 Encounter for screening for other suspected endocrine disorder: Secondary | ICD-10-CM

## 2017-08-04 LAB — CBC WITH DIFFERENTIAL/PLATELET
Basophils Absolute: 37 cells/uL (ref 0–200)
Basophils Relative: 0.4 %
Eosinophils Absolute: 316 cells/uL (ref 15–500)
Eosinophils Relative: 3.4 %
HCT: 38.3 % (ref 35.0–45.0)
Hemoglobin: 13 g/dL (ref 11.7–15.5)
Lymphs Abs: 2111 cells/uL (ref 850–3900)
MCH: 29.3 pg (ref 27.0–33.0)
MCHC: 33.9 g/dL (ref 32.0–36.0)
MCV: 86.3 fL (ref 80.0–100.0)
MPV: 10.3 fL (ref 7.5–12.5)
Monocytes Relative: 5.3 %
Neutro Abs: 6343 cells/uL (ref 1500–7800)
Neutrophils Relative %: 68.2 %
Platelets: 264 10*3/uL (ref 140–400)
RBC: 4.44 10*6/uL (ref 3.80–5.10)
RDW: 11.8 % (ref 11.0–15.0)
Total Lymphocyte: 22.7 %
WBC mixed population: 493 cells/uL (ref 200–950)
WBC: 9.3 10*3/uL (ref 3.8–10.8)

## 2017-08-04 LAB — COMPLETE METABOLIC PANEL WITH GFR
AG Ratio: 1.5 (calc) (ref 1.0–2.5)
ALT: 14 U/L (ref 6–29)
AST: 17 U/L (ref 10–30)
Albumin: 4.4 g/dL (ref 3.6–5.1)
Alkaline phosphatase (APISO): 48 U/L (ref 33–115)
BUN: 12 mg/dL (ref 7–25)
CO2: 23 mmol/L (ref 20–32)
CREATININE: 0.79 mg/dL (ref 0.50–1.10)
Calcium: 9.5 mg/dL (ref 8.6–10.2)
Chloride: 106 mmol/L (ref 98–110)
GFR, Est African American: 106 mL/min/{1.73_m2} (ref >=60)
GFR, Est Non African American: 92 mL/min/{1.73_m2} (ref >=60)
Globulin: 3 g/dL (calc) (ref 1.9–3.7)
Glucose, Bld: 89 mg/dL (ref 65–99)
Potassium: 4.3 mmol/L (ref 3.5–5.3)
Sodium: 140 mmol/L (ref 135–146)
Total Bilirubin: 0.5 mg/dL (ref 0.2–1.2)
Total Protein: 7.4 g/dL (ref 6.1–8.1)

## 2017-08-04 LAB — VITAMIN D 25 HYDROXY (VIT D DEFICIENCY, FRACTURES): VIT D 25 HYDROXY: 11 ng/mL — AB (ref 30–100)

## 2017-08-04 LAB — LIPID PANEL
CHOL/HDL RATIO: 3.4 (calc) (ref <5.0)
Cholesterol: 196 mg/dL (ref <200)
HDL: 57 mg/dL (ref >50)
LDL Cholesterol (Calc): 105 mg/dL (calc) — ABNORMAL HIGH
NON-HDL CHOLESTEROL (CALC): 139 mg/dL — AB (ref <130)
TRIGLYCERIDES: 225 mg/dL — AB (ref <150)

## 2017-08-04 LAB — TSH: TSH: 2.06 mIU/L

## 2017-08-06 ENCOUNTER — Ambulatory Visit: Payer: BC Managed Care – PPO

## 2017-08-06 ENCOUNTER — Ambulatory Visit
Admission: RE | Admit: 2017-08-06 | Discharge: 2017-08-06 | Disposition: A | Discharge status: Home / Self Care | Payer: BC Managed Care – PPO | Source: Ambulatory Visit | Attending: Internal Medicine | Admitting: Internal Medicine

## 2017-08-06 ENCOUNTER — Ambulatory Visit (INDEPENDENT_AMBULATORY_CARE_PROVIDER_SITE_OTHER): Payer: BC Managed Care – PPO | Admitting: Internal Medicine

## 2017-08-06 ENCOUNTER — Encounter: Payer: Self-pay | Admitting: Internal Medicine

## 2017-08-06 VITALS — BP 124/78 | HR 82 | Temp 98.7°F | Ht 65.0 in | Wt 205.0 lb

## 2017-08-06 DIAGNOSIS — Z803 Family history of malignant neoplasm of breast: Secondary | ICD-10-CM | POA: Diagnosis not present

## 2017-08-06 DIAGNOSIS — N63 Unspecified lump in unspecified breast: Secondary | ICD-10-CM

## 2017-08-06 DIAGNOSIS — E739 Lactose intolerance, unspecified: Secondary | ICD-10-CM

## 2017-08-06 DIAGNOSIS — E282 Polycystic ovarian syndrome: Secondary | ICD-10-CM | POA: Diagnosis not present

## 2017-08-06 DIAGNOSIS — E039 Hypothyroidism, unspecified: Secondary | ICD-10-CM | POA: Diagnosis not present

## 2017-08-06 DIAGNOSIS — Z0000 Encounter for general adult medical examination without abnormal findings: Secondary | ICD-10-CM | POA: Diagnosis not present

## 2017-08-06 DIAGNOSIS — E559 Vitamin D deficiency, unspecified: Secondary | ICD-10-CM | POA: Diagnosis not present

## 2017-08-06 DIAGNOSIS — E781 Pure hyperglyceridemia: Secondary | ICD-10-CM | POA: Diagnosis not present

## 2017-08-06 DIAGNOSIS — Z91013 Allergy to seafood: Secondary | ICD-10-CM

## 2017-08-06 LAB — POCT URINALYSIS DIPSTICK
Bilirubin, UA: NEGATIVE
Glucose, UA: NEGATIVE
LEUKOCYTES UA: NEGATIVE
Nitrite, UA: NEGATIVE
PH UA: 7 (ref 5.0–8.0)
Protein, UA: NEGATIVE
RBC UA: NEGATIVE
Spec Grav, UA: 1.025 (ref 1.010–1.025)
Urobilinogen, UA: 0.2 E.U./dL

## 2017-08-06 NOTE — Progress Notes (Signed)
Subjective:    Patient ID: Danielle Doyle, female    DOB: 08-22-1976, 41 y.o.   MRN: [id number]  HPI 41  Year old Female for health maintenance exam and evaluation of medical issues.    Hx polycyctic ovary disease. No hirsutism. Is on metformin per [facility] which does Pap smears (Dr. Melba Coon).  History of allergic rhinitis, shrimp allergy, GE reflux, hypothyroidism, herpes simplex type I.  Had H. pylori infection February 2010.  Laparoscopic cholecystectomy July 2004.  Ectopic pregnancy August 2010.  No known drug allergies.  Has been seen by Dr. Oretha Caprice in 2010 regarding her abdominal bloating and upper abdominal pain.  Is felt to have possible lactose intolerance.  He treated her with Nexium instead of Dexilant.  She has done well.  History of removal of endometrial polyps 2003, 2004, 2009 and 2010.  Had issues with infertility and tried in vitro fertilization but has not been successful.  Says she is not thinking about conceiving at the present time.  She suffered a miscarriage in January 2017.  Does not smoke.  Social alcohol consumption.  Family history: Both parents with history of hypertriglyceridemia.  Father with history of MI in his 25s.  Mother healthy except for elevated triglycerides.  Half sister with history of breast cancer.  No family history of breast cancer in maternal aunt.  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  She also works with the cheerleading team.  She is married.  This is her second marriage.  No children.     Review of Systems  Constitutional: Negative.   All other systems reviewed and are negative.      Objective:   Physical Exam  Constitutional: She is oriented to person, place, and time. She appears well-developed and well-nourished. No distress.  HENT:  Head: Normocephalic and atraumatic.  Right Ear: External ear normal.  Left Ear: External ear normal.  Mouth/Throat: Oropharynx  is clear and moist.  Eyes: Conjunctivae and EOM are normal. Pupils are equal, round, and reactive to light.  Neck: Neck supple. No JVD present. No thyromegaly present.  Cardiovascular: Normal rate, regular rhythm, normal heart sounds and intact distal pulses.  No murmur heard. Pulmonary/Chest: Effort normal and breath sounds normal. No respiratory distress. She has no wheezes. She has no rales.  Abdominal: Soft. Bowel sounds are normal. She exhibits no distension and no mass. There is no tenderness. There is no rebound and no guarding.  Genitourinary:  Genitourinary Comments: Deferred to GYN  Musculoskeletal: She exhibits no edema.  Lymphadenopathy:    She has no cervical adenopathy.  Neurological: She is alert and oriented to person, place, and time. She has normal reflexes. No cranial nerve deficit. Coordination normal.  Skin: Skin is warm and dry. She is not diaphoretic.  Psychiatric: She has a normal mood and affect. Her behavior is normal. Judgment and thought content normal.  Vitals reviewed.         Assessment & Plan:  Polycystic ovary syndrome  History of infertility  Hypothyroidism  Seafood allergy  History of H. pylori infection in the remote past  Hypertriglyceridemia treated with TriCor  Vitamin D deficiency  Family history of breast cancer  Probable lactose intolerance with irritable bowel symptoms  Plan: Continue TriCor and follow-up in 1 year or as needed.  Continue same dose of thyroid replacement.  Is to take 50,000 units weekly of Drisdol for 12 weeks then 2000 units daily of vitamin D3  daily.  TSH is normal.  Lipid panel shows improvement in triglycerides from 557 and 2017 to 225.  Hemoglobin A1c normal at 5.3%.

## 2017-08-12 ENCOUNTER — Telehealth: Payer: Self-pay

## 2017-08-12 NOTE — Telephone Encounter (Signed)
Received fax from [facility] in regards to a refill on Vitamin D2 for patient. It was declined pt should be taking OTC

## 2017-08-17 MED ORDER — ERGOCALCIFEROL 1.25 MG (50000 UT) PO CAPS: 50000.0000 [IU] | ORAL_CAPSULE | ORAL | 0 refills | Status: DC

## 2017-08-17 NOTE — Patient Instructions (Signed)
Take 50,000 units vitamin D3 weekly for 12 weeks and 2000 units daily.  Continue TriCor.  Continue same dose of thyroid replacement.

## 2017-08-29 ENCOUNTER — Other Ambulatory Visit: Payer: Self-pay | Admitting: Internal Medicine

## 2017-09-01 ENCOUNTER — Other Ambulatory Visit: Payer: Self-pay | Admitting: Obstetrics and Gynecology

## 2017-09-01 DIAGNOSIS — Z803 Family history of malignant neoplasm of breast: Secondary | ICD-10-CM

## 2017-09-18 ENCOUNTER — Inpatient Hospital Stay
Admission: RE | Admit: 2017-09-18 | Discharge: 2017-09-18 | Disposition: A | Discharge status: Home / Self Care | Payer: BC Managed Care – PPO | Source: Ambulatory Visit | Attending: Obstetrics and Gynecology | Admitting: Obstetrics and Gynecology

## 2017-11-02 ENCOUNTER — Other Ambulatory Visit: Payer: Self-pay

## 2017-11-02 MED ORDER — ERGOCALCIFEROL 1.25 MG (50000 UT) PO CAPS: 50000.0000 [IU] | ORAL_CAPSULE | ORAL | 0 refills | Status: DC

## 2018-01-18 ENCOUNTER — Other Ambulatory Visit: Payer: Self-pay | Admitting: Internal Medicine

## 2018-01-20 ENCOUNTER — Other Ambulatory Visit: Payer: Self-pay | Admitting: Internal Medicine

## 2018-01-20 DIAGNOSIS — N912 Amenorrhea, unspecified: Secondary | ICD-10-CM | POA: Insufficient documentation

## 2018-01-20 DIAGNOSIS — R140 Abdominal distension (gaseous): Secondary | ICD-10-CM | POA: Insufficient documentation

## 2018-01-20 DIAGNOSIS — E781 Pure hyperglyceridemia: Secondary | ICD-10-CM

## 2018-01-20 DIAGNOSIS — E559 Vitamin D deficiency, unspecified: Secondary | ICD-10-CM

## 2018-01-20 DIAGNOSIS — E039 Hypothyroidism, unspecified: Secondary | ICD-10-CM

## 2018-01-20 DIAGNOSIS — E785 Hyperlipidemia, unspecified: Secondary | ICD-10-CM

## 2018-01-22 ENCOUNTER — Other Ambulatory Visit: Payer: Self-pay | Admitting: Obstetrics and Gynecology

## 2018-01-22 DIAGNOSIS — Z803 Family history of malignant neoplasm of breast: Secondary | ICD-10-CM

## 2018-01-29 ENCOUNTER — Other Ambulatory Visit: Payer: Self-pay | Admitting: Internal Medicine

## 2018-01-29 NOTE — Addendum Note (Signed)
Addended by: Mady Haagensen on: 01/29/2018 12:18 PM   Modules accepted: Orders

## 2018-02-02 ENCOUNTER — Other Ambulatory Visit: Payer: BC Managed Care – PPO | Admitting: Internal Medicine

## 2018-02-04 ENCOUNTER — Ambulatory Visit: Payer: BC Managed Care – PPO | Admitting: Internal Medicine

## 2018-03-20 ENCOUNTER — Other Ambulatory Visit: Payer: Self-pay | Admitting: Internal Medicine

## 2018-03-20 NOTE — Telephone Encounter (Signed)
She is due for CPE after November 18. Please call her to schedule before refilling. Do not see appt in Epic and cannot refill until appt booked.

## 2018-04-01 ENCOUNTER — Other Ambulatory Visit: Payer: Self-pay

## 2018-04-09 ENCOUNTER — Other Ambulatory Visit: Payer: Self-pay

## 2018-04-09 MED ORDER — LEVOTHYROXINE SODIUM 75 MCG PO TABS: ORAL_TABLET | ORAL | 0 refills | Status: DC

## 2018-04-11 ENCOUNTER — Other Ambulatory Visit: Payer: Self-pay | Admitting: Internal Medicine

## 2018-05-31 ENCOUNTER — Other Ambulatory Visit: Payer: Self-pay | Admitting: Obstetrics and Gynecology

## 2018-05-31 DIAGNOSIS — Z1231 Encounter for screening mammogram for malignant neoplasm of breast: Secondary | ICD-10-CM

## 2018-06-23 ENCOUNTER — Ambulatory Visit
Admission: RE | Admit: 2018-06-23 | Discharge: 2018-06-23 | Disposition: A | Discharge status: Home / Self Care | Payer: BC Managed Care – PPO | Source: Ambulatory Visit | Attending: Obstetrics and Gynecology | Admitting: Obstetrics and Gynecology

## 2018-06-23 DIAGNOSIS — Z1231 Encounter for screening mammogram for malignant neoplasm of breast: Secondary | ICD-10-CM

## 2018-07-07 ENCOUNTER — Other Ambulatory Visit: Payer: Self-pay | Admitting: Internal Medicine

## 2018-07-09 ENCOUNTER — Other Ambulatory Visit: Payer: Self-pay | Admitting: Internal Medicine

## 2018-07-09 ENCOUNTER — Other Ambulatory Visit: Payer: BC Managed Care – PPO | Admitting: Internal Medicine

## 2018-08-03 ENCOUNTER — Telehealth: Payer: Self-pay | Admitting: Emergency Medicine

## 2018-08-03 MED ORDER — VALACYCLOVIR HCL 500 MG PO TABS: 500.0000 mg | ORAL_TABLET | Freq: Two times a day (BID) | ORAL | 6 refills | Status: DC

## 2018-08-03 NOTE — Telephone Encounter (Signed)
Pt called and requested a refill on her valACYclovir (VALTREX) 500 MG tablet. Pharmacy is [facility] [address] [facility] thanks.

## 2018-08-03 NOTE — Telephone Encounter (Signed)
Refill x 6 months. Has CPE soon

## 2018-08-09 ENCOUNTER — Other Ambulatory Visit: Payer: Self-pay | Admitting: Internal Medicine

## 2018-08-09 ENCOUNTER — Other Ambulatory Visit: Payer: BC Managed Care – PPO | Admitting: Internal Medicine

## 2018-08-09 DIAGNOSIS — E739 Lactose intolerance, unspecified: Secondary | ICD-10-CM

## 2018-08-09 DIAGNOSIS — E559 Vitamin D deficiency, unspecified: Secondary | ICD-10-CM

## 2018-08-09 DIAGNOSIS — Z0000 Encounter for general adult medical examination without abnormal findings: Secondary | ICD-10-CM

## 2018-08-09 DIAGNOSIS — E039 Hypothyroidism, unspecified: Secondary | ICD-10-CM

## 2018-08-09 DIAGNOSIS — E781 Pure hyperglyceridemia: Secondary | ICD-10-CM

## 2018-08-09 DIAGNOSIS — E282 Polycystic ovarian syndrome: Secondary | ICD-10-CM

## 2018-08-10 ENCOUNTER — Other Ambulatory Visit: Payer: BC Managed Care – PPO | Admitting: Internal Medicine

## 2018-08-10 DIAGNOSIS — E282 Polycystic ovarian syndrome: Secondary | ICD-10-CM

## 2018-08-10 DIAGNOSIS — E559 Vitamin D deficiency, unspecified: Secondary | ICD-10-CM

## 2018-08-10 DIAGNOSIS — Z0000 Encounter for general adult medical examination without abnormal findings: Secondary | ICD-10-CM

## 2018-08-10 DIAGNOSIS — E781 Pure hyperglyceridemia: Secondary | ICD-10-CM

## 2018-08-10 DIAGNOSIS — E739 Lactose intolerance, unspecified: Secondary | ICD-10-CM

## 2018-08-10 DIAGNOSIS — E039 Hypothyroidism, unspecified: Secondary | ICD-10-CM

## 2018-08-11 LAB — COMPLETE METABOLIC PANEL WITH GFR
AG Ratio: 1.8 (calc) (ref 1.0–2.5)
ALKALINE PHOSPHATASE (APISO): 46 U/L (ref 33–115)
ALT: 13 U/L (ref 6–29)
AST: 18 U/L (ref 10–30)
Albumin: 4.4 g/dL (ref 3.6–5.1)
BUN: 14 mg/dL (ref 7–25)
CHLORIDE: 105 mmol/L (ref 98–110)
CO2: 22 mmol/L (ref 20–32)
Calcium: 9.2 mg/dL (ref 8.6–10.2)
Creat: 0.74 mg/dL (ref 0.50–1.10)
GFR, Est African American: 114 mL/min/{1.73_m2} (ref >=60)
GFR, Est Non African American: 99 mL/min/{1.73_m2} (ref >=60)
GLUCOSE: 75 mg/dL (ref 65–99)
Globulin: 2.4 g/dL (calc) (ref 1.9–3.7)
Potassium: 4.3 mmol/L (ref 3.5–5.3)
Sodium: 139 mmol/L (ref 135–146)
Total Bilirubin: 0.4 mg/dL (ref 0.2–1.2)
Total Protein: 6.8 g/dL (ref 6.1–8.1)

## 2018-08-11 LAB — CBC WITH DIFFERENTIAL/PLATELET
Basophils Absolute: 50 cells/uL (ref 0–200)
Basophils Relative: 0.7 %
EOS PCT: 7.5 %
Eosinophils Absolute: 533 cells/uL — ABNORMAL HIGH (ref 15–500)
HEMATOCRIT: 36.6 % (ref 35.0–45.0)
HEMOGLOBIN: 12.5 g/dL (ref 11.7–15.5)
LYMPHS ABS: 2371 {cells}/uL (ref 850–3900)
MCH: 29.2 pg (ref 27.0–33.0)
MCHC: 34.2 g/dL (ref 32.0–36.0)
MCV: 85.5 fL (ref 80.0–100.0)
MPV: 10.3 fL (ref 7.5–12.5)
Monocytes Relative: 5.9 %
NEUTROS ABS: 3728 {cells}/uL (ref 1500–7800)
NEUTROS PCT: 52.5 %
Platelets: 246 10*3/uL (ref 140–400)
RBC: 4.28 10*6/uL (ref 3.80–5.10)
RDW: 11.9 % (ref 11.0–15.0)
Total Lymphocyte: 33.4 %
WBC mixed population: 419 cells/uL (ref 200–950)
WBC: 7.1 10*3/uL (ref 3.8–10.8)

## 2018-08-11 LAB — LIPID PANEL
CHOL/HDL RATIO: 4.3 (calc) (ref <5.0)
Cholesterol: 189 mg/dL (ref <200)
HDL: 44 mg/dL — ABNORMAL LOW (ref >50)
LDL CHOLESTEROL (CALC): 108 mg/dL — AB
Non-HDL Cholesterol (Calc): 145 mg/dL (calc) — ABNORMAL HIGH (ref <130)
Triglycerides: 261 mg/dL — ABNORMAL HIGH (ref <150)

## 2018-08-11 LAB — VITAMIN D 25 HYDROXY (VIT D DEFICIENCY, FRACTURES): Vit D, 25-Hydroxy: 11 ng/mL — ABNORMAL LOW (ref 30–100)

## 2018-08-11 LAB — TSH: TSH: 2.05 m[IU]/L

## 2018-08-12 ENCOUNTER — Ambulatory Visit (INDEPENDENT_AMBULATORY_CARE_PROVIDER_SITE_OTHER): Payer: BC Managed Care – PPO | Admitting: Internal Medicine

## 2018-08-12 ENCOUNTER — Encounter: Payer: Self-pay | Admitting: Internal Medicine

## 2018-08-12 VITALS — BP 110/80 | HR 87 | Ht 65.0 in | Wt 212.0 lb

## 2018-08-12 DIAGNOSIS — Z91013 Allergy to seafood: Secondary | ICD-10-CM

## 2018-08-12 DIAGNOSIS — E739 Lactose intolerance, unspecified: Secondary | ICD-10-CM | POA: Diagnosis not present

## 2018-08-12 DIAGNOSIS — Z803 Family history of malignant neoplasm of breast: Secondary | ICD-10-CM | POA: Diagnosis not present

## 2018-08-12 DIAGNOSIS — R0600 Dyspnea, unspecified: Secondary | ICD-10-CM

## 2018-08-12 DIAGNOSIS — E282 Polycystic ovarian syndrome: Secondary | ICD-10-CM

## 2018-08-12 DIAGNOSIS — E559 Vitamin D deficiency, unspecified: Secondary | ICD-10-CM | POA: Diagnosis not present

## 2018-08-12 DIAGNOSIS — Z0000 Encounter for general adult medical examination without abnormal findings: Secondary | ICD-10-CM | POA: Diagnosis not present

## 2018-08-12 DIAGNOSIS — E781 Pure hyperglyceridemia: Secondary | ICD-10-CM

## 2018-08-12 DIAGNOSIS — E039 Hypothyroidism, unspecified: Secondary | ICD-10-CM | POA: Diagnosis not present

## 2018-08-12 DIAGNOSIS — H6693 Otitis media, unspecified, bilateral: Secondary | ICD-10-CM

## 2018-08-12 MED ORDER — ERGOCALCIFEROL 1.25 MG (50000 UT) PO CAPS: 50000.0000 [IU] | ORAL_CAPSULE | ORAL | 2 refills | Status: AC

## 2018-08-12 MED ORDER — AZITHROMYCIN 250 MG PO TABS: ORAL_TABLET | ORAL | 0 refills | Status: DC

## 2018-08-12 MED ORDER — FLUCONAZOLE 150 MG PO TABS: 150.0000 mg | ORAL_TABLET | Freq: Once | ORAL | 0 refills | Status: AC

## 2018-08-12 NOTE — Progress Notes (Signed)
Subjective:    Patient ID: Danielle Doyle, female    DOB: 1976/03/10, 42 y.o.   MRN: [id number]  HPI 42 year old Female for health maintenance exam and evaluation of medical issues.  History of hypertriglyceridemia, history of polycystic ovary disease treated with metformin per gynecologist, history of hypothyroidism.  History of vitamin D deficiency.  Has developed respiratory infection symptoms recently.  Has discolored nasal drainage and maxillary sinus pressure.  Significant vitamin D deficiency at 39.  It was 11 a year ago.  I placed her once again on high-dose vitamin D 50,000 units.  History of allergic rhinitis, shrimp allergy, GE reflux, herpes simplex type I treated with Valtrex.  Had H. pylori infection February 2010.  Laparoscopic cholecystectomy July 2004.  Ectopic pregnancy August 2010.  Saw Dr. Oretha Caprice in 2010 for abdominal bloating and upper abdominal pain.  He felt she possibly had lactose intolerance.  Changed her from [place] to Nexium which she did well.  Had removal of endometrial polyps 2003, 2004, 2009 and 40 TM.  Had issues with infertility tried in vitro fertilization but was not successful.  He suffered a miscarriage January 2017.  Does not smoke.  Social alcohol consumption.  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  She and her husband recently purchased a home in [place] and will be moving.  She needed to cut back on activities at [employer] outside of her full-time job.  She is married.  This is her second marriage.  No children.  Family history: Both parents with history of hypertriglyceridemia.  Father with history of MI in his 38s.  Mother healthy except for elevated triglycerides.  Half sister with history of breast cancer.  History of breast cancer in maternal aunt.      Review of Systems respiratory infection symptoms today. She is  concerned about family history of coronary artery disease.  Has had some shortness of breath recently.  History of asthma and allergic rhinitis.  Will be referred to cardiology.  Recommend that she see allergist for food allergy testing.  History of shrimp allergy.    Objective:   Physical Exam  Constitutional: She is oriented to person, place, and time. She appears well-developed and well-nourished. No distress.  HENT:  Head: Normocephalic and atraumatic.  Mouth/Throat: Oropharynx is clear and moist.  Bilateral otitis media with TM is red and dull with full  Eyes: Pupils are equal, round, and reactive to light. Conjunctivae and EOM are normal. Right eye exhibits no discharge. Left eye exhibits no discharge.  Neck: Neck supple. No JVD present. No thyromegaly present.  Cardiovascular: Normal rate, regular rhythm and normal heart sounds.  No murmur heard. Pulmonary/Chest: Effort normal and breath sounds normal. No respiratory distress. She has no wheezes. She has no rales.  Abdominal: Soft. Bowel sounds are normal. She exhibits no distension and no mass. There is no tenderness. There is no rebound and no guarding.  Genitourinary:  Genitourinary Comments: Deferred to GYN  Musculoskeletal: She exhibits no edema.  Lymphadenopathy:    She has no cervical adenopathy.  Neurological: She is alert and oriented to person, place, and time. She displays normal reflexes. No cranial nerve deficit. She exhibits normal muscle tone. Coordination normal.  Skin: Skin is warm and dry. She is not diaphoretic.  Psychiatric: She has a normal mood and affect. Her behavior is normal. Judgment and thought content  normal.          Assessment & Plan:  Polycystic ovary syndrome treated with metformin  History of infertility  Hypothyroidism stable on thyroid replacement therapy  Seafood allergy  History of H. pylori infection in the remote past  Hypertriglyceridemia treated with TriCor  Vitamin D  deficiency- high-dose vitamin D once again reordered  Family history of breast cancer  Herpes simplex type I treated with as needed Valtrex  Probable lactose intolerance with irritable bowel symptoms  Shortness of breath and family history of coronary disease-wants to see cardiologist for evaluation  Acute respiratory infection with sinusitis/BOM -treat with Zithromax Z-PAK  Plan: Significant elevation in triglycerides of 261 and a year ago were 225.  Discussed adding Crestor to fenofibrate but she does not want to do that.  She may want to be seen at lipid clinic.  We should reevaluate in 6 months.  Patient has had some issues being compliant with appointments.  I really think she needs to be seen on a every 6 month basis regarding her lipids and vitamin D deficiency.

## 2018-08-12 NOTE — Patient Instructions (Addendum)
See allergist for food allergy testing. Referral to cardiology regarding Family hx CAD and SOB.  Take high-dose vitamin D weekly for 12 weeks.  Follow-up in 6 months.  Continue fenofibrate.  Watch diet.  Try to get some exercise.  Zithromax Z-PAK for respiratory infection.

## 2018-09-02 ENCOUNTER — Ambulatory Visit: Payer: BC Managed Care – PPO | Admitting: Allergy & Immunology

## 2018-09-28 ENCOUNTER — Ambulatory Visit (INDEPENDENT_AMBULATORY_CARE_PROVIDER_SITE_OTHER): Payer: BC Managed Care – PPO | Admitting: Allergy & Immunology

## 2018-09-28 ENCOUNTER — Encounter: Payer: Self-pay | Admitting: Allergy & Immunology

## 2018-09-28 VITALS — BP 128/80 | HR 94 | Resp 16 | Ht 64.0 in

## 2018-09-28 DIAGNOSIS — T781XXD Other adverse food reactions, not elsewhere classified, subsequent encounter: Secondary | ICD-10-CM

## 2018-09-28 DIAGNOSIS — J302 Other seasonal allergic rhinitis: Secondary | ICD-10-CM | POA: Diagnosis not present

## 2018-09-28 DIAGNOSIS — J3089 Other allergic rhinitis: Secondary | ICD-10-CM | POA: Diagnosis not present

## 2018-09-28 MED ORDER — EPINEPHRINE 0.3 MG/0.3ML IJ SOAJ: 0.3000 mg | Freq: Once | INTRAMUSCULAR | 2 refills | Status: AC

## 2018-09-28 NOTE — Patient Instructions (Addendum)
1. Chronic rhinitis - Testing today showed: trees, weeds, grasses, indoor molds, outdoor molds, dust mites, cat and cockroach - Copy of test results provided.   - Avoidance measures provided. - Continue with: Zyrtec (cetirizine) 10mg  tablet once daily - Start taking: Nasacort (triamcinolone) two sprays per nostril daily - You can use an extra dose of the antihistamine, if needed, for breakthrough symptoms.  - Consider nasal saline rinses 1-2 times daily to remove allergens from the nasal cavities as well as help with mucous clearance (this is especially helpful to do before the nasal sprays are given) - Consider allergy shots as a means of long-term control. - Allergy shots "re-train" and "reset" the immune system to ignore environmental allergens and decrease the resulting immune response to those allergens (sneezing, itchy watery eyes, runny nose, nasal congestion, etc).    - Allergy shots improve symptoms in 75-85% of patients.  - We can discuss more at the next appointment if the medications are not working for you.  2. Adverse food reaction  - Testing was slightly positive to: Soy and Pistachio but they were not huge. - We are going to get some blood work to try to clarify this.  - We will call you in 1-2 weeks with the results of the testing.  - We are going to do Jeff teaching just to be sure.  - They should call you in 1-2 weeks with the results of the testing.   3. Return in about 4 weeks (around 10/26/2018).   Please inform us of any Emergency Department visits, hospitalizations, or changes in symptoms. Call us before going to the ED for breathing or allergy symptoms since we might be able to fit you in for a sick visit. Feel free to contact us anytime with any questions, problems, or concerns.  It was a pleasure to meet you today!  Websites that have reliable patient information: 1. American Academy of Asthma, Allergy, and Immunology: www.aaaai.org 2. Food Allergy Research and  Education (FARE): foodallergy.org 3. Mothers of Asthmatics: http://www.asthmacommunitynetwork.org 4. American College of Allergy, Asthma, and Immunology: MonthlyElectricBill.co.uk   Make sure you are registered to vote! If you have moved or changed any of your contact information, you will need to get this updated before voting!    Voter ID laws are going into effect for the General Election in November 2020! Be prepared! Check out http://levine.com/ for more details.        Reducing Pollen Exposure  The American Academy of Allergy, Asthma and Immunology suggests the following steps to reduce your exposure to pollen during allergy seasons.    1. Do not hang sheets or clothing out to dry; pollen may collect on these items. 2. Do not mow lawns or spend time around freshly cut grass; mowing stirs up pollen. 3. Keep windows closed at night.  Keep car windows closed while driving. 4. Minimize morning activities outdoors, a time when pollen counts are usually at their highest. 5. Stay indoors as much as possible when pollen counts or humidity is high and on windy days when pollen tends to remain in the air longer. 6. Use air conditioning when possible.  Many air conditioners have filters that trap the pollen spores. 7. Use a HEPA room air filter to remove pollen form the indoor air you breathe.  Control of Mold Allergen   Mold and fungi can grow on a variety of surfaces provided certain temperature and moisture conditions exist.  Outdoor molds grow on plants, decaying vegetation and  soil.  The major outdoor mold, Alternaria and Cladosporium, are found in very high numbers during hot and dry conditions.  Generally, a late Summer - Fall peak is seen for common outdoor fungal spores.  Rain will temporarily lower outdoor mold spore count, but counts rise rapidly when the rainy period ends.  The most important indoor molds are Aspergillus and Penicillium.  Dark, humid and poorly ventilated  basements are ideal sites for mold growth.  The next most common sites of mold growth are the bathroom and the kitchen.  Outdoor (Seasonal) Mold Control 1. Use air conditioning and keep windows closed 2. Avoid exposure to decaying vegetation. 3. Avoid leaf raking. 4. Avoid grain handling. 5. Consider wearing a face mask if working in moldy areas.    Indoor (Perennial) Mold Control   1. Maintain humidity below 50%. 2. Clean washable surfaces with 5% bleach solution. 3. Remove sources e.g. contaminated carpets.    Control of Dog or Cat Allergen  Avoidance is the best way to manage a dog or cat allergy. If you have a dog or cat and are allergic to dog or cats, consider removing the dog or cat from the home. If you have a dog or cat but don't want to find it a new home, or if your family wants a pet even though someone in the household is allergic, here are some strategies that may help keep symptoms at bay:  1. Keep the pet out of your bedroom and restrict it to only a few rooms. Be advised that keeping the dog or cat in only one room will not limit the allergens to that room. 2. Don't pet, hug or kiss the dog or cat; if you do, wash your hands with soap and water. 3. High-efficiency particulate air (HEPA) cleaners run continuously in a bedroom or living room can reduce allergen levels over time. 4. Regular use of a high-efficiency vacuum cleaner or a central vacuum can reduce allergen levels. 5. Giving your dog or cat a bath at least once a week can reduce airborne allergen.  Control of House Dust Mite Allergen    House dust mites play a major role in allergic asthma and rhinitis.  They occur in environments with high humidity wherever human skin, the food for dust mites is found. High levels have been detected in dust obtained from mattresses, pillows, carpets, upholstered furniture, bed covers, clothes and soft toys.  The principal allergen of the house dust mite is found in its  feces.  A gram of dust may contain 1,000 mites and 250,000 fecal particles.  Mite antigen is easily measured in the air during house cleaning activities.    1. Encase mattresses, including the box spring, and pillow, in an air tight cover.  Seal the zipper end of the encased mattresses with wide adhesive tape. 2. Wash the bedding in water of 130 degrees Farenheit weekly.  Avoid cotton comforters/quilts and flannel bedding: the most ideal bed covering is the dacron comforter. 3. Remove all upholstered furniture from the bedroom. 4. Remove carpets, carpet padding, rugs, and non-washable window drapes from the bedroom.  Wash drapes weekly or use plastic window coverings. 5. Remove all non-washable stuffed toys from the bedroom.  Wash stuffed toys weekly. 6. Have the room cleaned frequently with a vacuum cleaner and a damp dust-mop.  The patient should not be in a room which is being cleaned and should wait 1 hour after cleaning before going into the room. 7. Close and seal  all heating outlets in the bedroom.  Otherwise, the room will become filled with dust-laden air.  An electric heater can be used to heat the room. 8. Reduce indoor humidity to less than 50%.  Do not use a humidifier.  Control of Cockroach Allergen  Cockroach allergen has been identified as an important cause of acute attacks of asthma, especially in urban settings.  There are fifty-five species of cockroach that exist in the [place], however only three, the [place], [place] species produce allergen that can affect patients with Asthma.  Allergens can be obtained from fecal particles, egg casings and secretions from cockroaches.    1. Remove food sources. 2. Reduce access to water. 3. Seal access and entry points. 4. Spray runways with 0.5-1% Diazinon or Chlorpyrifos 5. Blow boric acid power under stoves and refrigerator. 6. Place bait stations (hydramethylnon) at feeding sites.  Allergy Shots   Allergies are  the result of a chain reaction that starts in the immune system. Your immune system controls how your body defends itself. For instance, if you have an allergy to pollen, your immune system identifies pollen as an invader or allergen. Your immune system overreacts by producing antibodies called Immunoglobulin E (IgE). These antibodies travel to cells that release chemicals, causing an allergic reaction.  The concept behind allergy immunotherapy, whether it is received in the form of shots or tablets, is that the immune system can be desensitized to specific allergens that trigger allergy symptoms. Although it requires time and patience, the payback can be long-term relief.  How Do Allergy Shots Work?  Allergy shots work much like a vaccine. Your body responds to injected amounts of a particular allergen given in increasing doses, eventually developing a resistance and tolerance to it. Allergy shots can lead to decreased, minimal or no allergy symptoms.  There generally are two phases: build-up and maintenance. Build-up often ranges from three to six months and involves receiving injections with increasing amounts of the allergens. The shots are typically given once or twice a week, though more rapid build-up schedules are sometimes used.  The maintenance phase begins when the most effective dose is reached. This dose is different for each person, depending on how allergic you are and your response to the build-up injections. Once the maintenance dose is reached, there are longer periods between injections, typically two to four weeks.  Occasionally doctors give cortisone-type shots that can temporarily reduce allergy symptoms. These types of shots are different and should not be confused with allergy immunotherapy shots.  Who Can Be Treated with Allergy Shots?  Allergy shots may be a good treatment approach for people with allergic rhinitis (hay fever), allergic asthma, conjunctivitis (eye allergy)  or stinging insect allergy.   Before deciding to begin allergy shots, you should consider:  . The length of allergy season and the severity of your symptoms . Whether medications and/or changes to your environment can control your symptoms . Your desire to avoid long-term medication use . Time: allergy immunotherapy requires a major time commitment . Cost: may vary depending on your insurance coverage  Allergy shots for children age 25 and older are effective and often well tolerated. They might prevent the onset of new allergen sensitivities or the progression to asthma.  Allergy shots are not started on patients who are pregnant but can be continued on patients who become pregnant while receiving them. In some patients with other medical conditions or who take certain common medications, allergy shots may be  of risk. It is important to mention other medications you talk to your allergist.   When Will I Feel Better?  Some may experience decreased allergy symptoms during the build-up phase. For others, it may take as long as 12 months on the maintenance dose. If there is no improvement after a year of maintenance, your allergist will discuss other treatment options with you.  If you aren't responding to allergy shots, it may be because there is not enough dose of the allergen in your vaccine or there are missing allergens that were not identified during your allergy testing. Other reasons could be that there are high levels of the allergen in your environment or major exposure to non-allergic triggers like tobacco smoke.  What Is the Length of Treatment?  Once the maintenance dose is reached, allergy shots are generally continued for three to five years. The decision to stop should be discussed with your allergist at that time. Some people may experience a permanent reduction of allergy symptoms. Others may relapse and a longer course of allergy shots can be considered.  What Are the  Possible Reactions?  The two types of adverse reactions that can occur with allergy shots are local and systemic. Common local reactions include very mild redness and swelling at the injection site, which can happen immediately or several hours after. A systemic reaction, which is less common, affects the entire body or a particular body system. They are usually mild and typically respond quickly to medications. Signs include increased allergy symptoms such as sneezing, a stuffy nose or hives.  Rarely, a serious systemic reaction called anaphylaxis can develop. Symptoms include swelling in the throat, wheezing, a feeling of tightness in the chest, nausea or dizziness. Most serious systemic reactions develop within 30 minutes of allergy shots. This is why it is strongly recommended you wait in your doctor's office for 30 minutes after your injections. Your allergist is trained to watch for reactions, and his or her staff is trained and equipped with the proper medications to identify and treat them.  Who Should Administer Allergy Shots?  The preferred location for receiving shots is your prescribing allergist's office. Injections can sometimes be given at another facility where the physician and staff are trained to recognize and treat reactions, and have received instructions by your prescribing allergist.

## 2018-09-28 NOTE — Progress Notes (Signed)
NEW PATIENT  Date of Service/Encounter:  09/28/18  Referring provider: Elby Showers, MD   Assessment:   Seasonal and perennial allergic rhinitis (trees, weeds, grasses, indoor molds, outdoor molds, dust mites, cat and cockroach)  Adverse food reaction - with positive results to soy and pistachio today and a clinical history of reactivity to shellfish (but negative testing)   Plan/Recommendations:   1. Chronic rhinitis - Testing today showed: trees, weeds, grasses, indoor molds, outdoor molds, dust mites, cat and cockroach - Copy of test results provided.   - Avoidance measures provided. - Continue with: Zyrtec (cetirizine) 10mg  tablet once daily - Start taking: Nasacort (triamcinolone) two sprays per nostril daily - You can use an extra dose of the antihistamine, if needed, for breakthrough symptoms.  - Consider nasal saline rinses 1-2 times daily to remove allergens from the nasal cavities as well as help with mucous clearance (this is especially helpful to do before the nasal sprays are given) - Consider allergy shots as a means of long-term control. - Allergy shots "re-train" and "reset" the immune system to ignore environmental allergens and decrease the resulting immune response to those allergens (sneezing, itchy watery eyes, runny nose, nasal congestion, etc).    - Allergy shots improve symptoms in 75-85% of patients.  - We can discuss more at the next appointment if the medications are not working for you.  2. Adverse food reaction  - Testing was slightly positive to: Soy and Pistachio but they were not huge. - We are going to get some blood work to try to clarify this.  - We will call you in 1-2 weeks with the results of the testing.  - We are going to do [place] teaching just to be sure.  - They should call you in 1-2 weeks with the results of the testing.   3. Return in about 4 weeks (around 10/26/2018).  Subjective:   GEORGANNE SIPLE is a 43 y.o. female presenting  today for evaluation of  Chief Complaint  Patient presents with  . Food/Drug Challenge    throat itching, possible protien shakes and eating out, Avacados   . Allergic Reaction    seasonal     SHAQUANNA LYCAN has a history of the following: Patient Active Problem List   Diagnosis Date Noted  . Seasonal and perennial allergic rhinitis 09/29/2018  . Polycystic ovary disease 08/06/2017  . Shrimp allergy 04/15/2016  . Herpes simplex type 1 infection 11/07/2014  . Dependent edema 09/29/2013  . Asthma, exercise induced 09/25/2011  . Hypothyroidism 11/08/2010  . DYSPEPSIA 11/06/2010    History obtained from: chart review and patient.  [name] was referred by Elby Showers, MD.     Analisse is a 43 y.o. female presenting for an evaluation of possible food allergies.    Allergic Rhinitis Symptom History: She does have a history of environmental allergies, which she is rather lackadaisical about. She is certainly more concerned about her reaction to certain foods. However, she reports that she has allergic rhinitis symptoms essentially throughout the year. She has tried a multitude of medications to treat her symptoms, without much relief. She has never been tested in the past. At this point, she is having symptoms most markedly in the fall and spring. She does take cetirizine on a nearly daily basis. Occasionally she will use a nose spray although she does not particularly like using either medications or nose sprays.   Food Allergy Symptom History: These have become  a problem over the last few years. There is a strong family history of seafood allergies. She has started breaking out with shrimp exposure topically (seems like hives per the description). She tells me that avocado can cause scratching on the inside of her throat as well as with a soy protein shake. The protein shake is a pre-made vegan shake. It contains pea protein as the primary protein in it. She was doing this once per day  for breakfast. It is not severe but she can feel the throat itching and she decided to try to get this checked out. The avocado rxn has happened twice. It seems to happen more overall when she is out of her home eating. She is taking cetirizine to try to treat this.   She does tolerate peanuts, tree nuts, wheat, and eggs. She did an elimination diet years ago with a GI doctor. She got H pylori and then after that she was unable to "process milk or yogurt". She does eat cheese on pizza without a problem.     Otherwise, there is no history of other atopic diseases, including asthma, drug allergies, stinging insect allergies, eczema or urticaria. There is no significant infectious history. Vaccinations are up to date.    Past Medical History: Patient Active Problem List   Diagnosis Date Noted  . Seasonal and perennial allergic rhinitis 09/29/2018  . Polycystic ovary disease 08/06/2017  . Shrimp allergy 04/15/2016  . Herpes simplex type 1 infection 11/07/2014  . Dependent edema 09/29/2013  . Asthma, exercise induced 09/25/2011  . Hypothyroidism 11/08/2010  . DYSPEPSIA 11/06/2010    Medication List:  Allergies as of 09/28/2018      Reactions   Codeine Nausea And Vomiting   Doxycycline    Shellfish Allergy Diarrhea, Hives, Nausea And Vomiting   Flu like symptoms      Medication List       Accurate as of September 28, 2018 11:59 PM. Always use your most recent med list.        cholecalciferol 25 MCG (1000 UT) tablet Commonly known as:  VITAMIN D3 Take 1,000 Units by mouth daily.   EPINEPHrine 0.3 mg/0.3 mL Soaj injection Commonly known as:  AUVI-Q Inject 0.3 mLs (0.3 mg total) into the muscle once for 1 dose. As directed for life-threatening allergic reactions   ergocalciferol 1.25 MG (50000 UT) capsule Commonly known as:  DRISDOL Take 1 capsule (50,000 Units total) by mouth once a week.   fenofibrate 160 MG tablet TAKE 1 TABLET (160 MG TOTAL) BY MOUTH DAILY.   levothyroxine  75 MCG tablet Commonly known as:  SYNTHROID TAKE 1 TABLET BY MOUTH EVERY DAY   metFORMIN 500 MG tablet Commonly known as:  GLUCOPHAGE Take 500 mg by mouth daily with breakfast.   YASMIN 28 3-0.03 MG tablet Generic drug:  drospirenone-ethinyl estradiol Take 1 tablet by mouth daily.       Birth History: non-contributory  Developmental History: non-contributory  Past Surgical History: Past Surgical History:  Procedure Laterality Date  . GALLBLADDER SURGERY  2005     Family History: Family History  Problem Relation Age of Onset  . Healthy Mother   . Healthy Father   . Breast cancer Sister   . Breast cancer Maternal Aunt   . Breast cancer Maternal Grandmother      Social History: Leighann lives at home with her husband and two dogs. They live in a 65 month old home. There is hardwood throughout the main living areas and  carpeting in the bedrooms. They have a combination of gas and electric heating with central cooling. There are dust mite coverings on the bedding. There is no smoking exposure. 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 Her husband works for [employer] in [employer] area. They recently moved to [place].     Review of Systems: a 14-point review of systems is pertinent for what is mentioned in HPI.  Otherwise, all other systems were negative. Constitutional: negative other than that listed in the HPI Eyes: negative other than that listed in the HPI Ears, nose, mouth, throat, and face: negative other than that listed in the HPI Respiratory: negative other than that listed in the HPI Cardiovascular: negative other than that listed in the HPI Gastrointestinal: negative other than that listed in the HPI Genitourinary: negative other than that listed in the HPI Integument: negative other than that listed in the HPI Hematologic: negative other than that listed in the HPI Musculoskeletal: negative other than that listed in  the HPI Neurological: negative other than that listed in the HPI Allergy/Immunologic: negative other than that listed in the HPI    Objective:   Blood pressure 128/80, pulse 94, resp. rate 16, height 5\' 4"  (1.626 m), SpO2 99 %. Body mass index is 36.39 kg/m.   Physical Exam:  General: Alert, interactive, in no acute distress. Pleasant and talkative.  Eyes: No conjunctival injection bilaterally, no discharge on the right, no discharge on the left and no Horner-Trantas dots present. PERRL bilaterally. EOMI without pain. No photophobia.  Ears: Right TM pearly gray with normal light reflex, Left TM pearly gray with normal light reflex, Right TM intact without perforation and Left TM intact without perforation.  Nose/Throat: External nose within normal limits and septum midline. Turbinates edematous and pale with clear discharge. Posterior oropharynx erythematous without cobblestoning in the posterior oropharynx. Tonsils 2+ without exudates.  Tongue without thrush. Neck: Supple without thyromegaly. Trachea midline. Adenopathy: no enlarged lymph nodes appreciated in the anterior cervical, occipital, axillary, epitrochlear, inguinal, or popliteal regions. Lungs: Clear to auscultation without wheezing, rhonchi or rales. No increased work of breathing. CV: Normal S1/S2. No murmurs. Capillary refill <2 seconds.  Abdomen: Nondistended, nontender. No guarding or rebound tenderness. Bowel sounds faint and present in all fields  Skin: Warm and dry, without lesions or rashes. Extremities:  No clubbing, cyanosis or edema. Neuro:   Grossly intact. No focal deficits appreciated. Responsive to questions.  Diagnostic studies:    Allergy Studies:   Airborne Adult Perc - October 23, 2018 1433    Time Antigen Placed  1500    Allergen Manufacturer  Lavella Hammock    Location  Back    Number of Test  59    Panel 1  Select    1. Control-Buffer 50% Glycerol  Negative    2. Control-Histamine 1 mg/ml  2+    3. Albumin  saline  Negative    4. [place]  3+    5. [place]  2+    6. [place]  2+    7. [place] Blue  Negative    8. Meadow Fescue  Negative    9. Perennial Rye  Negative    10. Sweet Vernal  Negative    11. Timothy  Negative    12. Cocklebur  Negative    13. Burweed Marshelder  Negative    14. Ragweed, short  2+    15. Ragweed, Giant  Negative    16. Plantain,  English  Negative    17. Lamb's Quarters  Negative    18. Sheep Sorrell  Negative    19. Rough Pigweed  Negative    20. Marsh Elder, Rough  Negative    21. Mugwort, Common  2+    22. Ash mix  Negative    23. Birch mix  Negative    24. Beech American  2+    25. Box, Elder  2+    26. Cedar, red  Negative    27. Cottonwood, [place]  Negative    28. Elm mix  Negative    29. Hickory mix  Negative    30. Maple mix  Negative    31. Oak, [place] mix  Negative    32. Pecan Pollen  2+    33. Pine mix  Negative    34. Sycamore Eastern  2+    35. [place], Black Pollen  2+    36. Alternaria alternata  2+    37. Cladosporium Herbarum  2+    38. Aspergillus mix  Negative    39. Penicillium mix  Negative    40. Bipolaris sorokiniana (Helminthosporium)  Negative    41. Drechslera spicifera (Curvularia)  2+    42. Mucor plumbeus  2+    43. Fusarium moniliforme  Negative    44. Aureobasidium pullulans (pullulara)  2+    45. Rhizopus oryzae  2+    46. Botrytis cinera  Negative    47. Epicoccum nigrum  Negative    48. Phoma betae  Negative    49. Candida Albicans  Negative    50. Trichophyton mentagrophytes  Negative    51. Mite, D Farinae  5,000 AU/ml  3+    52. Mite, D Pteronyssinus  5,000 AU/ml  3+    53. Cat Hair 10,000 BAU/ml  2+    54.  Dog Epithelia  Negative    55. Mixed Feathers  Negative    56. Horse Epithelia  Negative    57. Cockroach, German  Negative    58. Mouse  Negative    59. Tobacco Leaf  Negative     Food Adult Perc - 09/28/18 1433    Allergen Manufacturer  Lavella Hammock    Location  Back    Number of allergen test  8      2. Soybean  --   +/-   8. Shellfish Mix  Negative    9. Fish Mix  Negative    17. Pistachio  --   +/-   45. Pea, Green/English  Negative    48. Avocado  Negative    65. Karaya Gum  Negative    66. Acacia (Arabic Gum)  Negative        Allergy testing results were read and interpreted by myself, documented by clinical staff.       Salvatore Marvel, MD Allergy and [place] [facility] [place]

## 2018-09-29 ENCOUNTER — Encounter: Payer: Self-pay | Admitting: Allergy & Immunology

## 2018-09-29 DIAGNOSIS — J3089 Other allergic rhinitis: Principal | ICD-10-CM

## 2018-09-29 DIAGNOSIS — J302 Other seasonal allergic rhinitis: Secondary | ICD-10-CM | POA: Insufficient documentation

## 2018-10-01 LAB — ALLERGY PANEL 18, NUT MIX GROUP
Allergen Coconut IgE: <0.1 kU/L
F020-IgE Almond: 0.18 kU/L — AB
F202-IgE Cashew Nut: <0.1 kU/L
Hazelnut (Filbert) IgE: <0.1 kU/L
Peanut IgE: 0.38 kU/L — AB
Pecan Nut IgE: <0.1 kU/L
Sesame Seed IgE: 0.2 kU/L — AB

## 2018-10-01 LAB — ALLERGY PANEL 19, SEAFOOD GROUP
Allergen Salmon IgE: <0.1 kU/L
Catfish: <0.1 kU/L
Codfish IgE: <0.1 kU/L
F023-IgE Crab: <0.1 kU/L
Shrimp IgE: <0.1 kU/L
Tuna: <0.1 kU/L

## 2018-10-01 LAB — ALLERGEN PEA F12: Allergen Green Pea IgE: <0.1 kU/L

## 2018-10-01 LAB — ALLERGEN SOYBEAN: Soybean IgE: 0.19 kU/L — AB

## 2018-10-05 ENCOUNTER — Other Ambulatory Visit: Payer: Self-pay | Admitting: Internal Medicine

## 2018-10-18 ENCOUNTER — Ambulatory Visit (INDEPENDENT_AMBULATORY_CARE_PROVIDER_SITE_OTHER): Payer: BC Managed Care – PPO | Admitting: Cardiology

## 2018-10-18 ENCOUNTER — Encounter: Payer: Self-pay | Admitting: Cardiology

## 2018-10-18 VITALS — BP 126/68 | HR 85 | Ht 65.0 in | Wt 214.4 lb

## 2018-10-18 DIAGNOSIS — E6609 Other obesity due to excess calories: Secondary | ICD-10-CM

## 2018-10-18 DIAGNOSIS — Z6835 Body mass index (BMI) 35.0-35.9, adult: Secondary | ICD-10-CM

## 2018-10-18 DIAGNOSIS — Z7189 Other specified counseling: Secondary | ICD-10-CM | POA: Diagnosis not present

## 2018-10-18 DIAGNOSIS — Z8249 Family history of ischemic heart disease and other diseases of the circulatory system: Secondary | ICD-10-CM

## 2018-10-18 DIAGNOSIS — E781 Pure hyperglyceridemia: Secondary | ICD-10-CM

## 2018-10-18 DIAGNOSIS — Z79899 Other long term (current) drug therapy: Secondary | ICD-10-CM | POA: Diagnosis not present

## 2018-10-18 MED ORDER — PRAVASTATIN SODIUM 20 MG PO TABS: 20.0000 mg | ORAL_TABLET | Freq: Every evening | ORAL | 3 refills | Status: DC

## 2018-10-18 NOTE — Patient Instructions (Signed)
Medication Instructions:  Stop: Fenofibrate 160 mg Start: Pravastatin 20 mg  If you need a refill on your cardiac medications before your next appointment, please call your pharmacy.   Lab work: Your physician recommends that you return for lab work in 3 months (lipid)  If you have labs (blood work) drawn today and your tests are completely normal, you will receive your results only by: Marland Kitchen MyChart Message (if you have MyChart) OR . A paper copy in the mail If you have any lab test that is abnormal or we need to change your treatment, we will call you to review the results.  Testing/Procedures: None  Follow-Up: At [facility], you and your health needs are our priority.  As part of our continuing mission to provide you with exceptional heart care, we have created designated Provider Care Teams.  These Care Teams include your primary Cardiologist (physician) and Advanced Practice Providers (APPs -  Physician Assistants and Nurse Practitioners) who all work together to provide you with the care you need, when you need it. You will need a follow up appointment in 3 months.  Please call our office 2 months in advance to schedule this appointment.  You may see Dr. Harrell Gave or one of the following Advanced Practice Providers on your designated Care Team:   Rosaria Ferries, PA-C . Jory Sims, DNP, ANP

## 2018-10-18 NOTE — Progress Notes (Signed)
Cardiology Office Note:    Date:  10/18/2018   ID:  GAL SMOLINSKI, DOB 07/08/1976, MRN [id number]  PCP:  Elby Showers, MD  Cardiologist:  Buford Dresser, MD PhD  Referring MD: Elby Showers, MD   CC: establish care, FH of MI, history of hypertriglyceridemia, prevention  History of Present Illness:    Danielle Doyle is a 43 y.o. female with a hx of seasonal allergies, exercise induced asthma, PCOS, hypothyroidism who is seen as a new consult at the request of Baxley, Cresenciano Lick, MD for the evaluation and management of hypertriglyceridemia. Referral notes shortness of breath, and this has resolved.   She was diagnosed with high triglycerides in the past. No history of pancreatitis, did have gallbladder removal. No chest pain. Shortness of breath improved, also found to have multiple allergies with allergy testing. No PND, orthopnea, palpitations, syncope. Has been told she has an intermittent abnormal heart rhythm; noted in childhood, occurred during her surgery. Has not been problematic or bothersome. Had a lot of stress in her life, diet and exercise worsened, but recent changes have made her hopeful that she will be able to improve on this.  Father had MI in his early 19s. Was generally healthy and active prior to that, but did have high triglycerides. 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 Father's side of the family has high prevalence of strokes, but no other heart disease.  History reviewed. No pertinent past medical history.  Past Surgical History:  Procedure Laterality Date  . GALLBLADDER SURGERY  2005    Current Medications: Current Outpatient Medications on File Prior to Visit  Medication Sig  . cholecalciferol (VITAMIN D3) 25 MCG (1000 UT) tablet Take 1,000 Units by mouth daily.  . ergocalciferol (DRISDOL) 1.25 MG (50000 UT) capsule Take 1 capsule (50,000 Units total) by mouth once a week.  . levothyroxine (SYNTHROID) 75 MCG tablet TAKE 1 TABLET BY MOUTH EVERY DAY    . metFORMIN (GLUCOPHAGE) 500 MG tablet Take 500 mg by mouth daily with breakfast.   . YASMIN 28 3-0.03 MG tablet Take 1 tablet by mouth daily.  . valACYclovir (VALTREX) 500 MG tablet as needed.   No current facility-administered medications on file prior to visit.      Allergies:   Codeine; Doxycycline; and Shellfish allergy   Social History   Socioeconomic History  . Marital status: Married    Spouse name: Not on file  . Number of children: Not on file  . Years of education: Not on file  . Highest education level: Not on file  Occupational History  . Not on file  Social Needs  . Financial resource strain: Not on file  . Food insecurity:    Worry: Not on file    Inability: Not on file  . Transportation needs:    Medical: Not on file    Non-medical: Not on file  Tobacco Use  . Smoking status: Never Smoker  . Smokeless tobacco: Never Used  Substance and Sexual Activity  . Alcohol use: Yes    Comment: rarely  . Drug use: No  . Sexual activity: Yes    Birth control/protection: None  Lifestyle  . Physical activity:    Days per week: Not on file    Minutes per session: Not on file  . Stress: Not on file  Relationships  . Social connections:    Talks on phone: Not on file    Gets together: Not on file  Attends religious service: Not on file    Active member of club or organization: Not on file    Attends meetings of clubs or organizations: Not on file    Relationship status: Not on file  Other Topics Concern  . Not on file  Social History Narrative  . Not on file     Family History: The patient's family history includes Breast cancer in her maternal aunt, maternal grandmother, and sister; Healthy in her father and mother. Father had MI in his early 31s  ROS:   Please see the history of present illness.  Additional pertinent ROS:  Constitutional: Negative for chills, fever, night sweats, unintentional weight loss  HENT: Negative for ear pain and hearing loss.    Eyes: Negative for loss of vision and eye pain.  Respiratory: Negative for cough, sputum, shortness of breath, wheezing.   Cardiovascular: Negative for chest pain, palpitations, PND, orthopnea, lower extremity edema and claudication.  Gastrointestinal: Negative for abdominal pain, melena, and hematochezia.  Genitourinary: Negative for dysuria and hematuria.  Musculoskeletal: Negative for falls and myalgias.  Skin: Negative for itching and rash.  Neurological: Negative for focal weakness, focal sensory changes and loss of consciousness.  Endo/Heme/Allergies: Does not bruise/bleed easily.    EKGs/Labs/Other Studies Reviewed:    The following studies were reviewed today: No prior cardiac studies available  EKG:  EKG is personally reviewed.  The ekg ordered today demonstrates sinus rhythm with sinus arrhythmia  Recent Labs: 08/10/2018: ALT 13; BUN 14; Creat 0.74; Hemoglobin 12.5; Platelets 246; Potassium 4.3; Sodium 139; TSH 2.05  Recent Lipid Panel    Component Value Date/Time   CHOL 189 08/10/2018 0952   TRIG 261 (H) 08/10/2018 0952   HDL 44 (L) 08/10/2018 0952   CHOLHDL 4.3 08/10/2018 0952   VLDL 40 (H) 02/10/2017 1020   LDLCALC 108 (H) 08/10/2018 6144    Physical Exam:    VS:  BP 126/68 (BP Location: Right Arm)   Pulse 85   Ht 5\' 5"  (1.651 m)   Wt 214 lb 6.4 oz (97.3 kg)   BMI 35.68 kg/m     Wt Readings from Last 3 Encounters:  10/18/18 214 lb 6.4 oz (97.3 kg)  08/12/18 212 lb (96.2 kg)  08/06/17 205 lb (93 kg)     GEN: Well nourished, well developed in no acute distress HEENT: Normal NECK: No JVD; No carotid bruits LYMPHATICS: No lymphadenopathy CARDIAC: regular rhythm, normal S1 and S2, no murmurs, rubs, gallops. Radial and DP pulses 2+ bilaterally. RESPIRATORY:  Clear to auscultation without rales, wheezing or rhonchi  ABDOMEN: Soft, non-tender, non-distended MUSCULOSKELETAL:  No edema; No deformity  SKIN: Warm and dry NEUROLOGIC:  Alert and oriented x  3 PSYCHIATRIC:  Normal affect   ASSESSMENT:    1. Pure hypertriglyceridemia   2. Medication management   3. Cardiac risk counseling   4. Family history of heart disease   5. Class 2 obesity due to excess calories without serious comorbidity with body mass index (BMI) of 35.0 to 35.9 in adult    PLAN:    1. Hypertriglyceridemia, with family history of MI in her father: supports metabolic syndrome type picture, with central obesity, hypertriglyceridemia, PCOS and metformin. HDL and blood pressure do not meet threshold for diagnosis.   -we discussed etiology of elevated TG, including pathophysiology and recommendations for treatment  -we discussed multiple means of additional risk stratification, including NMR lipids, lp(a), hs-CRP, calcium score  -we discussed medical therapy options, including fibrates, statins, and  vascepa. This included risks and benefits, as well as available data re: CV risk reduction in primary prevention  -we discussed dietary and exercise goals. We discussed that while low fat/Mediterranean diet is recommended for general heart health, there is evidence for diets low in simple carbohydrates in hypertriglyceridemia  -we discussed red flag warning symptoms that warrant immediate medical attention  After discussing all the above options, shared decision making resulted in a plan to: -stop fenofibrate -start pravastatin 20 mg -work on diet and lifestyle, and ideally also weight loss -follow up with fasting lipid profile and visit in 3 mos, and discuss at that time if plan needs to be adjusted   2. Primary prevention -recommend heart healthy/Mediterranean diet, with whole grains, fruits, vegetable, fish, lean meats, nuts, and olive oil. Limit salt. -recommend moderate walking, 3-5 times/week for 30-50 minutes each session. Aim for at least 150 minutes.week. Goal should be pace of 3 miles/hours, or walking 1.5 miles in 30 minutes -recommend avoidance of tobacco  products. Avoid excess alcohol. -Additional risk factor control:  -Diabetes: A1c is . On metformin for PCOS, not diabetes.   -Lipids: as above  -Blood pressure control: at goal, on no medications  -Weight: BMI 35, class 2 obesity, discussed weight loss and lifestyle as above -ASCVD risk score: actually an overestimate as she does not carry diabetes diagnosis The 10-year ASCVD risk score Mikey Bussing DC Jr., et al., 2013) is: 1.8%   Values used to calculate the score:     Age: 71 years     Sex: Female     Is Non-Hispanic African American: No     Diabetic: Yes     Tobacco smoker: No     Systolic Blood Pressure: 063 mmHg     Is BP treated: No     HDL Cholesterol: 44 mg/dL     Total Cholesterol: 189 mg/dL   Plan for follow up: 3 mos, recheck fasting lipids at that time.  TIME SPENT WITH PATIENT: 60 minutes of direct patient care. More than 50% of that time was spent on coordination of care and counseling regarding etiology, evaluation, and management of cardiac risk with family history and elevated triglycerides.  Buford Dresser, MD, PhD [place]  [facility]   Medication Adjustments/Labs and Tests Ordered: Current medicines are reviewed at length with the patient today.  Concerns regarding medicines are outlined above.  Orders Placed This Encounter  Procedures  . Lipid panel   Meds ordered this encounter  Medications  . pravastatin (PRAVACHOL) 20 MG tablet    Sig: Take 1 tablet (20 mg total) by mouth every evening.    Dispense:  90 tablet    Refill:  3    Patient Instructions  Medication Instructions:  Stop: Fenofibrate 160 mg Start: Pravastatin 20 mg  If you need a refill on your cardiac medications before your next appointment, please call your pharmacy.   Lab work: Your physician recommends that you return for lab work in 3 months (lipid)  If you have labs (blood work) drawn today and your tests are completely normal, you will receive your results only  by: Marland Kitchen MyChart Message (if you have MyChart) OR . A paper copy in the mail If you have any lab test that is abnormal or we need to change your treatment, we will call you to review the results.  Testing/Procedures: None  Follow-Up: At [facility], you and your health needs are our priority.  As part of our continuing mission to provide  you with exceptional heart care, we have created designated Provider Care Teams.  These Care Teams include your primary Cardiologist (physician) and Advanced Practice Providers (APPs -  Physician Assistants and Nurse Practitioners) who all work together to provide you with the care you need, when you need it. You will need a follow up appointment in 3 months.  Please call our office 2 months in advance to schedule this appointment.  You may see Dr. Harrell Gave or one of the following Advanced Practice Providers on your designated Care Team:   Rosaria Ferries, PA-C . Jory Sims, DNP, ANP       Signed, Buford Dresser, MD PhD 10/18/2018 5:26 PM    [place]

## 2018-10-19 ENCOUNTER — Ambulatory Visit: Payer: BC Managed Care – PPO | Admitting: Cardiovascular Disease

## 2018-10-19 NOTE — Addendum Note (Signed)
Addended by: Jacqulynn Cadet on: 10/19/2018 01:48 PM   Modules accepted: Orders

## 2018-10-28 ENCOUNTER — Ambulatory Visit: Payer: BC Managed Care – PPO | Admitting: Allergy & Immunology

## 2018-11-16 ENCOUNTER — Ambulatory Visit (INDEPENDENT_AMBULATORY_CARE_PROVIDER_SITE_OTHER): Payer: BC Managed Care – PPO | Admitting: Allergy & Immunology

## 2018-11-16 ENCOUNTER — Encounter: Payer: Self-pay | Admitting: Allergy & Immunology

## 2018-11-16 ENCOUNTER — Other Ambulatory Visit: Payer: Self-pay

## 2018-11-16 VITALS — BP 122/82 | HR 88 | Temp 98.7°F | Resp 16

## 2018-11-16 DIAGNOSIS — J302 Other seasonal allergic rhinitis: Secondary | ICD-10-CM

## 2018-11-16 DIAGNOSIS — T781XXD Other adverse food reactions, not elsewhere classified, subsequent encounter: Secondary | ICD-10-CM | POA: Diagnosis not present

## 2018-11-16 DIAGNOSIS — J3089 Other allergic rhinitis: Secondary | ICD-10-CM | POA: Diagnosis not present

## 2018-11-16 NOTE — Progress Notes (Signed)
FOLLOW UP  Date of Service/Encounter:  11/16/18   Assessment:   Seasonal and perennial allergic rhinitis (trees, weeds, grasses, indoor molds, outdoor molds, dust mites, cat and cockroach)  Adverse food reaction (soy, shellfish)   Danielle Doyle returns for a follow-up visit.  She has done well with avoidance of soy, which seems to have been the trigger for her reactions.  She did have an accidental exposure to soy when she ate a piece of cake from a local bakery.  She has continued to eat tree nuts, despite her mildly positive testing.  I think this is fine as long as she continues to tolerate it without adverse event.  She is not very interested in introducing shellfish into her diet, since she has avoided it for 40 years.  However, she may try using shellfish for bait for fishing expeditions, since this is how she discovered she was reacting to it in the first place.  She continues to drink her protein shake, which was causing reactions at the last visit.  She took a break from drinking it and has not reacted since.  She thinks it might of been a cross-contaminatio with soy, although her protein shake contained pea protein rather than soy.  Her allergic rhinitis is controlled with a nasal steroid.  She does not feel that allergy shots are needed at this time, and she is worried about starting them since she lives about an hour away from our clinic.  I did tell her that we could likely administer the injections at the health center at [facility], which might make it easier for her to advance her concentrations.  Regardless, at this point, her symptoms are controlled with the Nasacort alone.  Plan/Recommendations:   1. Chronic rhinitis (trees, weeds, grasses, indoor molds, outdoor molds, dust mites, cat and cockroach) - I am glad that things are going well with the nose spray and antihistamine.  - Continue with: Zyrtec (cetirizine) 10mg  tablet once daily and Nasacort (triamcinolone) two sprays per  nostril daily - I think we can hold off on allergy shots at this point.    2. Adverse food reaction (soy, seafood) - Continue to avoid the foods since you are doing a good job.  Danielle Doyle is up to date.   3. Return in about 1 year (around 11/17/2019).  Subjective:   Danielle Doyle is a 43 y.o. female presenting today for follow up of  Chief Complaint  Patient presents with  . Allergic Rhinitis     Danielle Doyle has a history of the following: Patient Active Problem List   Diagnosis Date Noted  . Seasonal and perennial allergic rhinitis 09/29/2018  . Polycystic ovary disease 08/06/2017  . Shrimp allergy 04/15/2016  . Herpes simplex type 1 infection 11/07/2014  . Dependent edema 09/29/2013  . Asthma, exercise induced 09/25/2011  . Hypothyroidism 11/08/2010  . DYSPEPSIA 11/06/2010    History obtained from: chart review and patient.  Danielle Doyle is a 43 y.o. female presenting for a follow up visit.  She was last seen in October 01, 2018.  At that time, she had testing that was positive to trees, weeds, grasses, molds, dust mite, cat, and cockroach.  We continued Zyrtec 10 mg daily.  We added on Nasacort 2 sprays per nostril daily.  We did discuss allergy shots.  She was reporting some adverse food reactions.  She was slightly positive to soy and pistachio, but they were not huge.  We did obtain some blood work  to clarify this more.  The seafood panel was completely negative.  Her tree nut mix demonstrated positives to sesame, peanut, and Allmond, but they were all very small.  Green pea was negative.  Soy was slightly elevated at 0.19.  Since the last visit, she has mostly done well. She did have some red velvet cake with some throat itching. She treated with Benadryl with improvement in the symptoms.   Allergic Rhinitis Symptom History: She does notice that the nasal spray is helping. She is fine as long as she is diligent with staying on the nasal spray. She can notice when she has a gap. We  can avoid allergy shots for now. She is not in town often enough to get the injections.   Food Allergy Symptom History: She continues to avoid soy in all forms.  She did have an accidental ingestion when she ate some red velvet cake, but this was treated with Benadryl without any worsening of her symptoms.  She has been better about reading ingredients.  She continues to eat tree nuts, despite her positive testing.  We had told her this was fine as long as she was not having any problems with exposure.  She is not interested in introducing shellfish back into her diet.  Her Auvi-Q was delivered and she does have this on hand if needed.   Otherwise, there is no history of other atopic diseases, including asthma, drug allergies, stinging insect allergies, eczema, urticaria or contact dermatitis. There is no significant infectious history. Vaccinations are up to date.   Otherwise, there have been no changes to her past medical history, surgical history, family history, or social history.    Review of Systems  Constitutional: Negative.  Negative for fever, malaise/fatigue and weight loss.  HENT: Negative.  Negative for congestion, ear discharge and ear pain.   Eyes: Negative for pain, discharge and redness.  Respiratory: Negative for cough, sputum production, shortness of breath and wheezing.   Cardiovascular: Negative.  Negative for chest pain and palpitations.  Gastrointestinal: Negative for abdominal pain, heartburn, nausea and vomiting.  Skin: Negative.  Negative for itching and rash.  Neurological: Negative for dizziness and headaches.  Endo/Heme/Allergies: Positive for environmental allergies. Does not bruise/bleed easily.       Objective:   Blood pressure 122/82, pulse 88, temperature 98.7 F (37.1 C), temperature source Oral, resp. rate 16, SpO2 97 %. There is no height or weight on file to calculate BMI.   Physical Exam:  Physical Exam  Constitutional: She appears  well-developed.  HENT:  Head: Normocephalic and atraumatic.  Right Ear: Tympanic membrane, external ear and ear canal normal.  Left Ear: Tympanic membrane and ear canal normal.  Nose: No mucosal edema, rhinorrhea, nasal deformity or septal deviation. No epistaxis. Right sinus exhibits no maxillary sinus tenderness and no frontal sinus tenderness. Left sinus exhibits no maxillary sinus tenderness and no frontal sinus tenderness.  Mouth/Throat: Uvula is midline and oropharynx is clear and moist. Mucous membranes are not pale and not dry.  Eyes: Pupils are equal, round, and reactive to light. Conjunctivae and EOM are normal. Right eye exhibits no chemosis and no discharge. Left eye exhibits no chemosis and no discharge. Right conjunctiva is not injected. Left conjunctiva is not injected.  Cardiovascular: Normal rate, regular rhythm and normal heart sounds.  Respiratory: Effort normal and breath sounds normal. No accessory muscle usage. No tachypnea. No respiratory distress. She has no wheezes. She has no rhonchi. She has no rales. She exhibits  no tenderness.  Lymphadenopathy:    She has no cervical adenopathy.  Neurological: She is alert.  Skin: No abrasion, no petechiae and no rash noted. Rash is not papular, not vesicular and not urticarial. No erythema. No pallor.  Psychiatric: She has a normal mood and affect.     Diagnostic studies: none     Salvatore Marvel, MD  [facility]

## 2018-11-16 NOTE — Patient Instructions (Addendum)
1. Chronic rhinitis (trees, weeds, grasses, indoor molds, outdoor molds, dust mites, cat and cockroach) - I am glad that things are going well with the nose spray and antihistamine.  - Continue with: Zyrtec (cetirizine) 10mg  tablet once daily and Nasacort (triamcinolone) two sprays per nostril daily - I think we can hold off on allergy shots at this point.    2. Adverse food reaction (soy, seafood) - Continue to avoid the foods since you are doing a good job.  Danielle Doyle is up to date.   3. Return in about 1 year (around 11/17/2019).   Please inform us of any Emergency Department visits, hospitalizations, or changes in symptoms. Call us before going to the ED for breathing or allergy symptoms since we might be able to fit you in for a sick visit. Feel free to contact us anytime with any questions, problems, or concerns.  It was a pleasure to see you again today! I am glad that things are going well for you!   Websites that have reliable patient information: 1. American Academy of Asthma, Allergy, and Immunology: www.aaaai.org 2. Food Allergy Research and Education (FARE): foodallergy.org 3. Mothers of Asthmatics: http://www.asthmacommunitynetwork.org 4. American College of Allergy, Asthma, and Immunology: MonthlyElectricBill.co.uk   Make sure you are registered to vote! If you have moved or changed any of your contact information, you will need to get this updated before voting!    Voter ID laws are NOT going into effect for the General Election in November 2020! DO NOT let this stop you from exercising your right to vote!

## 2018-11-30 ENCOUNTER — Other Ambulatory Visit: Payer: Self-pay

## 2018-11-30 MED ORDER — FENOFIBRATE 160 MG PO TABS: 160.0000 mg | ORAL_TABLET | Freq: Every day | ORAL | 3 refills | Status: DC

## 2019-01-14 ENCOUNTER — Ambulatory Visit: Payer: BC Managed Care – PPO | Admitting: Cardiology

## 2019-01-14 ENCOUNTER — Telehealth: Payer: Self-pay

## 2019-01-14 NOTE — Telephone Encounter (Signed)

## 2019-01-24 ENCOUNTER — Telehealth: Payer: Self-pay | Admitting: Cardiology

## 2019-01-25 ENCOUNTER — Telehealth (INDEPENDENT_AMBULATORY_CARE_PROVIDER_SITE_OTHER): Payer: BC Managed Care – PPO | Admitting: Cardiology

## 2019-01-25 VITALS — BP 127/71 | HR 78 | Ht 65.0 in | Wt 214.0 lb

## 2019-01-25 DIAGNOSIS — Z79899 Other long term (current) drug therapy: Secondary | ICD-10-CM

## 2019-01-25 DIAGNOSIS — E785 Hyperlipidemia, unspecified: Secondary | ICD-10-CM | POA: Diagnosis not present

## 2019-01-25 DIAGNOSIS — Z7189 Other specified counseling: Secondary | ICD-10-CM

## 2019-01-25 MED ORDER — PRAVASTATIN SODIUM 20 MG PO TABS: 20.0000 mg | ORAL_TABLET | Freq: Every evening | ORAL | 3 refills | Status: DC

## 2019-01-25 NOTE — Patient Instructions (Signed)
Medication Instructions:  Your Physician recommend you continue on your current medication as directed.    If you need a refill on your cardiac medications before your next appointment, please call your pharmacy.   Lab work: Your physician recommends that you return for lab work in 1 month (Lipid, LFT)  If you have labs (blood work) drawn today and your tests are completely normal, you will receive your results only by: Marland Kitchen MyChart Message (if you have MyChart) OR . A paper copy in the mail If you have any lab test that is abnormal or we need to change your treatment, we will call you to review the results.  Testing/Procedures: None  Follow-Up: At [facility], you and your health needs are our priority.  As part of our continuing mission to provide you with exceptional heart care, we have created designated Provider Care Teams.  These Care Teams include your primary Cardiologist (physician) and Advanced Practice Providers (APPs -  Physician Assistants and Nurse Practitioners) who all work together to provide you with the care you need, when you need it. You will need a follow up appointment in 1 years.  Please call our office 2 months in advance to schedule this appointment.  You may see Buford Dresser, MD or one of the following Advanced Practice Providers on your designated Care Team:   Rosaria Ferries, PA-C . Jory Sims, DNP, ANP

## 2019-01-25 NOTE — Progress Notes (Signed)
Virtual Visit via Video Note   This visit type was conducted due to national recommendations for restrictions regarding the COVID-19 Pandemic (e.g. social distancing) in an effort to limit this patient's exposure and mitigate transmission in our community.  Due to her co-morbid illnesses, this patient is at least at moderate risk for complications without adequate follow up.  This format is felt to be most appropriate for this patient at this time.  All issues noted in this document were discussed and addressed.  A limited physical exam was performed with this format.  Please refer to the patient's chart for her consent to telehealth for [facility].   Date:  01/25/2019   ID:  Danielle Doyle, DOB 03/02/1976, MRN [id number]  Patient Location: Home Provider Location: Home  PCP:  Elby Showers, MD  Cardiologist:  Buford Dresser, MD  Electrophysiologist:  None   Evaluation Performed:  Follow-Up Visit  Chief Complaint:  Follow up  History of Present Illness:    Danielle Doyle is a 43 y.o. female with hyperlipidemia, seasonal allergies, exercise induced asthma, PCOS, hypothyroidism.  The patient does not have symptoms concerning for COVID-19 infection (fever, chills, cough, or new shortness of breath).   Update today: Overall doing well, working from home. Staying safe, reducing risk of exposure to COVID.   Has worked very hard on lifestyle changes. Worked her way back to running, on a training program. Eating healthy at home, working to cut back carbs. Weight has been stable, thinks she is gaining muscle and losing fat. Tolerating switch from fenofibrate to pravastatin. Had a mild amount of joint pain, was wondering if this is from increased activity or statin. Reviewed typical pattern of statin myopathy.   Denies chest pain, shortness of breath at rest or with normal exertion. No PND, orthopnea, LE edema or unexpected weight gain. No syncope or palpitations.  No past medical  history on file. Past Surgical History:  Procedure Laterality Date  . GALLBLADDER SURGERY  2005     Current Meds  Medication Sig  . cholecalciferol (VITAMIN D3) 25 MCG (1000 UT) tablet Take 1,000 Units by mouth daily.  . ergocalciferol (DRISDOL) 1.25 MG (50000 UT) capsule Take 1 capsule (50,000 Units total) by mouth once a week.  . levothyroxine (SYNTHROID) 75 MCG tablet TAKE 1 TABLET BY MOUTH EVERY DAY  . metFORMIN (GLUCOPHAGE) 500 MG tablet Take 500 mg by mouth daily with breakfast.   . valACYclovir (VALTREX) 500 MG tablet as needed.  Marland Kitchen YASMIN 28 3-0.03 MG tablet Take 1 tablet by mouth daily.  . [DISCONTINUED] fenofibrate 160 MG tablet Take 1 tablet (160 mg total) by mouth daily.  pravastatin 20 mg daily  Allergies:   Codeine; Doxycycline; and Shellfish allergy   Social History   Tobacco Use  . Smoking status: Never Smoker  . Smokeless tobacco: Never Used  Substance Use Topics  . Alcohol use: Yes    Comment: rarely  . Drug use: No     Family Hx: The patient's family history includes Breast cancer in her maternal aunt, maternal grandmother, and sister; Healthy in her father and mother.  ROS:   Please see the history of present illness.    Constitutional: Negative for chills, fever, night sweats, unintentional weight loss  HENT: Negative for ear pain and hearing loss.   Eyes: Negative for loss of vision and eye pain.  Respiratory: Negative for cough, sputum, wheezing.   Cardiovascular: See HPI. Gastrointestinal: Negative for abdominal pain, melena, and hematochezia.  Genitourinary: Negative for dysuria and hematuria.  Musculoskeletal: Negative for falls and myalgias.  Skin: Negative for itching and rash.  Neurological: Negative for focal weakness, focal sensory changes and loss of consciousness.  Endo/Heme/Allergies: Does not bruise/bleed easily.  All other systems reviewed and are negative.   Prior CV studies:   The following studies were reviewed today: None  available  Labs/Other Tests and Data Reviewed:    EKG:  An ECG dated 10/18/18 was personally reviewed today and demonstrated:  SR with sinus arrhythmia  Recent Labs: 08/10/2018: ALT 13; BUN 14; Creat 0.74; Hemoglobin 12.5; Platelets 246; Potassium 4.3; Sodium 139; TSH 2.05   Recent Lipid Panel Lab Results  Component Value Date/Time   CHOL 189 08/10/2018 09:52 AM   TRIG 261 (H) 08/10/2018 09:52 AM   HDL 44 (L) 08/10/2018 09:52 AM   CHOLHDL 4.3 08/10/2018 09:52 AM   LDLCALC 108 (H) 08/10/2018 09:52 AM    Wt Readings from Last 3 Encounters:  01/25/19 214 lb (97.1 kg)  10/18/18 214 lb 6.4 oz (97.3 kg)  08/12/18 212 lb (96.2 kg)     Objective:    Vital Signs:  BP 127/71   Pulse 78   Ht 5\' 5"  (1.651 m)   Wt 214 lb (97.1 kg)   BMI 35.61 kg/m    VITAL SIGNS:  reviewed GEN:  no acute distress EYES:  sclerae anicteric, EOMI - Extraocular Movements Intact RESPIRATORY:  normal respiratory effort, symmetric expansion CARDIOVASCULAR:  no JVD SKIN:  no rash, lesions or ulcers. MUSCULOSKELETAL:  no obvious deformities. NEURO:  alert and oriented x 3, no obvious focal deficit PSYCH:  normal affect  ASSESSMENT & PLAN:    Hypertriglyceridemia, with family history of MI in her father: discussed at length at initial visit.  -previously reviewed risks associated with metabolic syndrome, family history, how we risk stratify, medical therapy options, and dietary recommendations. -she has done an excellent job making lifestyle changes, especially exercise -tolerating pravastatin 20 mg well. Her pattern of mild joint ache is more consistent with return to activity and less of statin myopathy, but recommended to let me know if her symptoms change.  -we will order fasting lipid panel (can be done when she is next in [place], lives an hour away). Also check LFTs given this was start of statin -based on results of lipids, will discuss if any changes need to be made.  -ASCVD risk score:  actually an overestimate as she does not carry diabetes diagnosis (metormin for PCOS)  COVID-19 Education: The signs and symptoms of COVID-19 were discussed with the patient and how to seek care for testing (follow up with PCP or arrange E-visit).  The importance of social distancing was discussed today.  Time:   Today, I have spent 16 minutes with the patient with telehealth technology discussing the above problems.    Patient Instructions  Medication Instructions:  Your Physician recommend you continue on your current medication as directed.    If you need a refill on your cardiac medications before your next appointment, please call your pharmacy.   Lab work: Your physician recommends that you return for lab work in 1 month (Lipid, LFT)  If you have labs (blood work) drawn today and your tests are completely normal, you will receive your results only by: Marland Kitchen MyChart Message (if you have MyChart) OR . A paper copy in the mail If you have any lab test that is abnormal or we need to change your treatment, we will call  you to review the results.  Testing/Procedures: None  Follow-Up: At [facility], you and your health needs are our priority.  As part of our continuing mission to provide you with exceptional heart care, we have created designated Provider Care Teams.  These Care Teams include your primary Cardiologist (physician) and Advanced Practice Providers (APPs -  Physician Assistants and Nurse Practitioners) who all work together to provide you with the care you need, when you need it. You will need a follow up appointment in 1 years.  Please call our office 2 months in advance to schedule this appointment.  You may see Buford Dresser, MD or one of the following Advanced Practice Providers on your designated Care Team:   Rosaria Ferries, PA-C . Jory Sims, DNP, ANP      Medication Adjustments/Labs and Tests Ordered: Current medicines are reviewed at length with  the patient today.  Concerns regarding medicines are outlined above.   Tests Ordered: Orders Placed This Encounter  Procedures  . Lipid panel  . Hepatic function panel    Medication Changes: Meds ordered this encounter  Medications  . pravastatin (PRAVACHOL) 20 MG tablet    Sig: Take 1 tablet (20 mg total) by mouth every evening.    Dispense:  90 tablet    Refill:  3    Disposition:  Follow up 1 year or sooner PRN. Will follow up lab results.  Signed, Buford Dresser, MD  01/25/2019 11:30 AM    [place]

## 2019-01-26 ENCOUNTER — Encounter: Payer: Self-pay | Admitting: Cardiology

## 2019-02-08 ENCOUNTER — Other Ambulatory Visit: Payer: BC Managed Care – PPO | Admitting: Internal Medicine

## 2019-02-10 ENCOUNTER — Ambulatory Visit: Payer: BC Managed Care – PPO | Admitting: Internal Medicine

## 2019-03-07 ENCOUNTER — Other Ambulatory Visit: Payer: Self-pay | Admitting: Internal Medicine

## 2019-03-07 NOTE — Telephone Encounter (Signed)
Please call her. Needs CPE November booked before refilling this

## 2019-03-21 NOTE — Telephone Encounter (Signed)
Opened in error

## 2019-04-19 NOTE — Telephone Encounter (Signed)
LVM to CB and schedule CPE for November

## 2019-04-28 ENCOUNTER — Other Ambulatory Visit: Payer: Self-pay | Admitting: Obstetrics and Gynecology

## 2019-04-28 DIAGNOSIS — Z803 Family history of malignant neoplasm of breast: Secondary | ICD-10-CM

## 2019-05-02 NOTE — Telephone Encounter (Signed)
LVM to CB and schedule CPE for November

## 2019-05-06 NOTE — Telephone Encounter (Signed)
Scheduled CPE 

## 2019-05-19 ENCOUNTER — Other Ambulatory Visit: Payer: Self-pay

## 2019-05-19 ENCOUNTER — Ambulatory Visit
Admission: RE | Admit: 2019-05-19 | Discharge: 2019-05-19 | Disposition: A | Discharge status: Home / Self Care | Payer: BC Managed Care – PPO | Source: Ambulatory Visit | Attending: Obstetrics and Gynecology | Admitting: Obstetrics and Gynecology

## 2019-05-19 DIAGNOSIS — Z803 Family history of malignant neoplasm of breast: Secondary | ICD-10-CM

## 2019-05-19 MED ORDER — GADOBUTROL 1 MMOL/ML IV SOLN
10.0000 mL | Freq: Once | INTRAVENOUS | Status: AC | PRN
  Administered 2019-05-19: 10 mL via INTRAVENOUS

## 2019-06-06 DIAGNOSIS — M7581 Other shoulder lesions, right shoulder: Secondary | ICD-10-CM | POA: Insufficient documentation

## 2019-08-11 ENCOUNTER — Other Ambulatory Visit: Payer: Self-pay | Admitting: Obstetrics and Gynecology

## 2019-08-11 DIAGNOSIS — Z1231 Encounter for screening mammogram for malignant neoplasm of breast: Secondary | ICD-10-CM

## 2019-08-22 ENCOUNTER — Other Ambulatory Visit: Payer: BC Managed Care – PPO | Admitting: Internal Medicine

## 2019-08-22 ENCOUNTER — Other Ambulatory Visit: Payer: Self-pay

## 2019-08-22 DIAGNOSIS — E039 Hypothyroidism, unspecified: Secondary | ICD-10-CM

## 2019-08-22 LAB — TSH: TSH: 2.77 mIU/L

## 2019-08-23 ENCOUNTER — Encounter: Payer: BC Managed Care – PPO | Admitting: Internal Medicine

## 2019-08-26 ENCOUNTER — Encounter: Payer: Self-pay | Admitting: Internal Medicine

## 2019-08-26 ENCOUNTER — Ambulatory Visit (INDEPENDENT_AMBULATORY_CARE_PROVIDER_SITE_OTHER): Payer: BC Managed Care – PPO | Admitting: Internal Medicine

## 2019-08-26 VITALS — Ht 65.0 in | Wt 214.0 lb

## 2019-08-26 DIAGNOSIS — F411 Generalized anxiety disorder: Secondary | ICD-10-CM

## 2019-08-26 DIAGNOSIS — E781 Pure hyperglyceridemia: Secondary | ICD-10-CM

## 2019-08-26 DIAGNOSIS — J301 Allergic rhinitis due to pollen: Secondary | ICD-10-CM

## 2019-08-26 DIAGNOSIS — E559 Vitamin D deficiency, unspecified: Secondary | ICD-10-CM

## 2019-08-26 DIAGNOSIS — K219 Gastro-esophageal reflux disease without esophagitis: Secondary | ICD-10-CM

## 2019-08-26 DIAGNOSIS — E739 Lactose intolerance, unspecified: Secondary | ICD-10-CM

## 2019-08-26 DIAGNOSIS — E039 Hypothyroidism, unspecified: Secondary | ICD-10-CM | POA: Diagnosis not present

## 2019-08-26 DIAGNOSIS — E282 Polycystic ovarian syndrome: Secondary | ICD-10-CM

## 2019-08-26 DIAGNOSIS — J3081 Allergic rhinitis due to animal (cat) (dog) hair and dander: Secondary | ICD-10-CM

## 2019-08-26 DIAGNOSIS — Z91018 Allergy to other foods: Secondary | ICD-10-CM

## 2019-08-26 DIAGNOSIS — J3089 Other allergic rhinitis: Secondary | ICD-10-CM

## 2019-08-26 MED ORDER — ALPRAZOLAM 0.5 MG PO TABS: 0.5000 mg | ORAL_TABLET | Freq: Two times a day (BID) | ORAL | 0 refills | Status: AC | PRN

## 2019-08-26 MED ORDER — LEVOTHYROXINE SODIUM 75 MCG PO TABS: ORAL_TABLET | ORAL | 0 refills | Status: DC

## 2019-08-26 NOTE — Patient Instructions (Addendum)
Continue same dose of thyroid replacement.  Follow-up in 6 months for health maintenance exam and  in person visit with fasting labs.

## 2019-08-26 NOTE — Progress Notes (Signed)
   Subjective:    Patient ID: Danielle Doyle, female    DOB: 21-Dec-1975, 43 y.o.   MRN: [id number]  HPI 43 year old Female seen today via interactive audio and video telecommunications due to coronavirus pandemic.  She is agreeable to visit in this format today.  She is identified using 2 identifiers as Danielle Doyle, a patient in this practice.  She has a history of anxiety and hypothyroidism.  She formally worked at [employer] but she and her husband moved to [facility] and she will no longer be working at [employer].  She had TSH drawn November 30 which was normal at 2.77.  She has multiple food allergies.  She saw Dr. Harrell Gave, cardiologist in May regarding hyperlipidemia.  In November 2019 triglycerides were 261, total cholesterol 189 and LDL cholesterol 108.  Family history of MI in her father.  Patient was started on Pravachol 20 mg daily.    Patient has multiple food allergies and seasonal allergic rhinitis.  Followed by Dr. Ernst Bowler.  Allergy testing had showed positivity to trees, weeds, grasses, indoor molds, outdoor molds, dust mites cat and cockroach.  She is on Nasacort and Zyrtec.  Last health maintenance exam was November 2019 here.  History of herpes simplex type I treated with Valtrex.  Had laparoscopic cholecystectomy July 2004.  Ectopic pregnancy August 2010.  History of GE reflux and possible lactose intolerance seen by gastroenterologist 2010 treated with PPI.  History of vitamin D deficiency.  History of polycystic ovary disease treated with Metformin per gynecologist.      Review of Systems see above     Objective:   Physical Exam Patient was seen virtually in no acute distress.  Reports no significant issues at this point in time.  Reports that her weight is 214 pounds and BMI is 35.61     Assessment & Plan:  BMI 35.61  Hypothyroidism with stable TSH  Hyperlipidemia started by cardiologist on statin medication.  Needs follow-up lipid panel  Seasonal  allergic rhinitis treated by Dr. Ernst Bowler  Food allergies  History of anxiety treated with Xanax  Plan: Encourage diet exercise and weight loss.  Was not able to exercise when she was commuting from [facility] to [place].  Now she has new job and may be able to have more time for exercise and diet.  Continue current dose of thyroid replacement.  She will need health maintenance exam in the new year.

## 2019-09-23 ENCOUNTER — Other Ambulatory Visit: Payer: Self-pay | Admitting: Internal Medicine

## 2019-10-27 ENCOUNTER — Other Ambulatory Visit: Payer: Self-pay | Admitting: *Deleted

## 2019-10-27 DIAGNOSIS — E785 Hyperlipidemia, unspecified: Secondary | ICD-10-CM

## 2019-10-27 MED ORDER — PRAVASTATIN SODIUM 20 MG PO TABS: 20.0000 mg | ORAL_TABLET | Freq: Every evening | ORAL | 1 refills | Status: DC

## 2019-10-27 NOTE — Telephone Encounter (Signed)
Rx has been sent to the pharmacy electronically. ° °

## 2019-11-28 DIAGNOSIS — N941 Unspecified dyspareunia: Secondary | ICD-10-CM | POA: Insufficient documentation

## 2019-11-29 ENCOUNTER — Ambulatory Visit (INDEPENDENT_AMBULATORY_CARE_PROVIDER_SITE_OTHER): Payer: BC Managed Care – PPO | Admitting: Internal Medicine

## 2019-11-29 ENCOUNTER — Ambulatory Visit
Admission: RE | Admit: 2019-11-29 | Discharge: 2019-11-29 | Disposition: A | Discharge status: Home / Self Care | Payer: BC Managed Care – PPO | Source: Ambulatory Visit | Attending: Obstetrics and Gynecology | Admitting: Obstetrics and Gynecology

## 2019-11-29 ENCOUNTER — Encounter: Payer: Self-pay | Admitting: Internal Medicine

## 2019-11-29 ENCOUNTER — Other Ambulatory Visit: Payer: Self-pay

## 2019-11-29 VITALS — BP 110/80 | HR 93 | Temp 98.0°F | Ht 65.0 in | Wt 222.0 lb

## 2019-11-29 DIAGNOSIS — Z0000 Encounter for general adult medical examination without abnormal findings: Secondary | ICD-10-CM | POA: Diagnosis not present

## 2019-11-29 DIAGNOSIS — E282 Polycystic ovarian syndrome: Secondary | ICD-10-CM | POA: Diagnosis not present

## 2019-11-29 DIAGNOSIS — N644 Mastodynia: Secondary | ICD-10-CM | POA: Insufficient documentation

## 2019-11-29 DIAGNOSIS — N939 Abnormal uterine and vaginal bleeding, unspecified: Secondary | ICD-10-CM | POA: Insufficient documentation

## 2019-11-29 DIAGNOSIS — Z6836 Body mass index (BMI) 36.0-36.9, adult: Secondary | ICD-10-CM

## 2019-11-29 DIAGNOSIS — Z8742 Personal history of other diseases of the female genital tract: Secondary | ICD-10-CM | POA: Insufficient documentation

## 2019-11-29 DIAGNOSIS — J301 Allergic rhinitis due to pollen: Secondary | ICD-10-CM

## 2019-11-29 DIAGNOSIS — E039 Hypothyroidism, unspecified: Secondary | ICD-10-CM | POA: Diagnosis not present

## 2019-11-29 DIAGNOSIS — F411 Generalized anxiety disorder: Secondary | ICD-10-CM

## 2019-11-29 DIAGNOSIS — E781 Pure hyperglyceridemia: Secondary | ICD-10-CM | POA: Diagnosis not present

## 2019-11-29 DIAGNOSIS — Z1231 Encounter for screening mammogram for malignant neoplasm of breast: Secondary | ICD-10-CM

## 2019-11-29 DIAGNOSIS — Z91018 Allergy to other foods: Secondary | ICD-10-CM

## 2019-11-29 DIAGNOSIS — R102 Pelvic and perineal pain: Secondary | ICD-10-CM | POA: Insufficient documentation

## 2019-11-29 DIAGNOSIS — E559 Vitamin D deficiency, unspecified: Secondary | ICD-10-CM

## 2019-11-29 DIAGNOSIS — R8290 Unspecified abnormal findings in urine: Secondary | ICD-10-CM

## 2019-11-29 LAB — POCT URINALYSIS DIPSTICK
Appearance: NEGATIVE
Bilirubin, UA: NEGATIVE
Glucose, UA: NEGATIVE
Ketones, UA: NEGATIVE
Nitrite, UA: NEGATIVE
Odor: NEGATIVE
Protein, UA: NEGATIVE
Spec Grav, UA: 1.01 (ref 1.010–1.025)
Urobilinogen, UA: 0.2 E.U./dL
pH, UA: 6 (ref 5.0–8.0)

## 2019-11-29 NOTE — Progress Notes (Signed)
Subjective:    Patient ID: Danielle Doyle, female    DOB: [birth year], 44 y.o.   MRN: [id number]  HPI 44 year old Female for health maintenance exam and evaluation of medical issues.  History of hypertriglyceridemia polycystic ovary disease treated with Metformin per her gynecologist, history of hypothyroidism.  History of vitamin D deficiency.  She is now living in [facility] and working at [employer].  May want to find a physician closer to her home.  History of allergic rhinitis, shrimp allergy, GE reflux, herpes simplex type I treated with Valtrex, history of H. pylori infection February 2010.  Laparoscopic cholecystectomy July 2004.  Ectopic pregnancy August 2010.  Saw Dr. Oretha Caprice in 2010 for abdominal bloating and upper abdominal pain.  He felt she had lactose intolerance.  She did change from Dexilant to Nexium and did well.  History of removal of endometrial polyps 2003, 2004, 2009 and 2010.  Had issues with infertility.  Tried in vitro fertilization but was not successful.  She suffered a miscarriage in January 2017.  Social history: Married.  No children.  This is her second marriage.  Social alcohol consumption.  Non-smoker.  Family history: Both parents with history of hypertriglyceridemia.  Father with history of MI in his 46s.  Mother healthy except for elevated triglycerides.  Half sister with history of breast cancer.  History of breast cancer in maternal aunt.      Review of Systems gained 10 pounds since November 2019. Exercising walking 2-3 miles a day. Had fall last year and injured on a road while walking dog and went to rehab through [facility]     Objective:   Physical Exam Blood pressure 110/80 pulse 93 temperature 98 degrees orally pulse oximetry 98% weight 222 pounds BMI 36.94.  Skin warm and dry.  Nodes none.  Neck is supple without JVD thyromegaly or carotid bruits.  TMs are clear.  Breast without masses.  Chest clear to  auscultation.  Cardiac exam regular rate and rhythm without murmurs or gallops.  Abdomen obese soft nondistended without hepatosplenomegaly masses or tenderness.  No lower extremity edema.  Neuro intact without focal deficits.  Thought judgment and affect are normal.       Assessment & Plan:  BMI 36.94-has gained 10 pounds since last year.  Now that she has gotten moved and settled she can start to work on diet exercise and weight loss.  May want to find dietitian near her new home and she may also want to find a primary care physician there as well.  Hypothyroidism-stable with current dose of thyroid replacement  History of vitamin D deficiency  Impaired glucose tolerance  Hyperlipidemia treated with Pravachol  Abnormal urine specimen-urine dipstick is abnormal.  Culture obtained and grew mixed flora  Plan: Patient to have fasting labs through [facility] near her home.  Order given.  Addendum: CBC with differential is normal.  Fasting glucose is normal.  Triglycerides are 475.  Patient reportedly was fasting.  TSH is 3.160.  Vitamin D is very low at 12.1.  She needs to be on weekly high-dose vitamin D supplement.  Hemoglobin A1c is excellent at 5.2%.  My feeling is she has not been consistent with her lipid-lowering medication.  She has gained weight.  Not exercising as much as she should.  However her hemoglobin A1c is under excellent control.  Her vitamin D is very low.  Vitamin D supplement weekly has been refilled for 6 months.  I am  recommending that she take Pravachol with fenofibrate.  Dr. Harrell Gave had recommended Pravachol.  Patient says she is taking it.  Has not been taking fenofibrate.  We need to get triglycerides lower.  I recommend follow-up here in 3 months or with new provider.

## 2019-11-30 LAB — URINE CULTURE
MICRO NUMBER:: 10230879
SPECIMEN QUALITY:: ADEQUATE

## 2019-12-02 ENCOUNTER — Other Ambulatory Visit: Payer: Self-pay | Admitting: Internal Medicine

## 2019-12-02 ENCOUNTER — Telehealth: Payer: Self-pay | Admitting: Internal Medicine

## 2019-12-02 ENCOUNTER — Encounter: Payer: Self-pay | Admitting: Internal Medicine

## 2019-12-02 DIAGNOSIS — E781 Pure hyperglyceridemia: Secondary | ICD-10-CM

## 2019-12-02 NOTE — Telephone Encounter (Signed)
Received report from [facility] where patient went for fasting labs.  Tried to call patient with results of left detailed message.  Her triglycerides are high at 474.  Dr. Harrell Gave had previously placed her on Pravachol.  Need to know if she is still taking it.  TSH follows within normal limits at 3.160.  Vitamin D level is low at 12.1 and have refilled vitamin D supplementation weekly for 6 months.  CBC is normal.  Liver functions are normal.  Hemoglobin A1c is normal at 5.2%.  Have left message asking patient to call us and let me know how to move forward with this that [place]

## 2019-12-02 NOTE — Telephone Encounter (Signed)
Danielle Doyle called back to say she is taking the Pravachol 1-20 mg tablet a day, she is not taking the fenofibrate since she started taking the Pravachol.

## 2019-12-02 NOTE — Telephone Encounter (Signed)
Pt was notified of results and instructions, pt verbalized understanding.   

## 2019-12-02 NOTE — Telephone Encounter (Signed)
I would suggest fenofibrate and Pravachol with follow up in 3 months

## 2019-12-19 ENCOUNTER — Encounter: Payer: Self-pay | Admitting: Internal Medicine

## 2019-12-19 NOTE — Patient Instructions (Signed)
Labs reviewed.  Has significant hypertriglyceridemia.  Needs take both Pravachol and fenofibrate.  Follow-up here in 3 months.  I have renewed vitamin D high-dose supplement weekly for an additional 6 months his level is low at 12.  Needs to lose weight diet and exercise.  Has gained 10 pounds since last year.

## 2019-12-28 NOTE — Addendum Note (Signed)
Addended by: Mady Haagensen on: 12/28/2019 04:45 PM   Modules accepted: Orders

## 2020-02-07 ENCOUNTER — Telehealth: Payer: Self-pay | Admitting: Internal Medicine

## 2020-02-07 ENCOUNTER — Other Ambulatory Visit: Payer: Self-pay | Admitting: Internal Medicine

## 2020-02-07 MED ORDER — FENOFIBRATE 160 MG PO TABS: 160.0000 mg | ORAL_TABLET | Freq: Every day | ORAL | 0 refills | Status: DC

## 2020-02-07 NOTE — Telephone Encounter (Signed)
Has a 3 month follow up in June.

## 2020-02-07 NOTE — Telephone Encounter (Signed)
Pt calling to get a refill on a medication because she changed pharmacies and they wont let her transfer it. fenofibrate 160 MG tablet, [facility] [phone]

## 2020-02-23 NOTE — Telephone Encounter (Signed)
Pt called in and said that the pharmacy wouldn't fill this medication because they were waiting on question to be answered by the doctors office that they had sent back after receiving the prescription, Do you know anything about this?

## 2020-02-23 NOTE — Telephone Encounter (Signed)
It was sent on 02/08/20, called pharmacy to make sure they have it and they have it.

## 2020-02-27 ENCOUNTER — Ambulatory Visit: Payer: BC Managed Care – PPO | Admitting: Internal Medicine

## 2020-03-19 ENCOUNTER — Ambulatory Visit: Payer: BC Managed Care – PPO | Admitting: Internal Medicine

## 2020-05-21 ENCOUNTER — Other Ambulatory Visit: Payer: Self-pay | Admitting: Internal Medicine

## 2020-05-21 DIAGNOSIS — E039 Hypothyroidism, unspecified: Secondary | ICD-10-CM

## 2020-05-21 MED ORDER — LEVOTHYROXINE SODIUM 75 MCG PO TABS: ORAL_TABLET | ORAL | 0 refills | Status: DC

## 2020-05-21 NOTE — Telephone Encounter (Signed)
Danielle Doyle [phone]  [place] called to see if you please refill below medication, she stated that the appointment she canceled in June because it was not convenient for her to come the distance to our office from where she lives now.and,did it not have anything to do with this medication, her last labs for her thyroid was good. She has not established care with new PCP, said she was waiting till closer to time for her CPE that will not be due until March of next year. I explained that she would need to go ahead and find a new PCP if it was not convenient for her to come to our office to do her follow up and labs.

## 2020-05-21 NOTE — Telephone Encounter (Addendum)
I am refilling Levothyroxine for 30 day sonly. She will be receiving a letter requesting that she find a new Primary Care physician closer to her home as it is not convenient for her to keep appointments here with her move to [place] area.

## 2020-05-24 NOTE — Telephone Encounter (Addendum)
Mailed dismissal letter 05/23/2020

## 2020-05-29 ENCOUNTER — Other Ambulatory Visit: Payer: Self-pay | Admitting: Cardiology

## 2020-05-29 DIAGNOSIS — E785 Hyperlipidemia, unspecified: Secondary | ICD-10-CM

## 2020-06-13 ENCOUNTER — Other Ambulatory Visit: Payer: Self-pay | Admitting: Internal Medicine

## 2020-06-13 ENCOUNTER — Encounter: Payer: Self-pay | Admitting: Internal Medicine
